# Patient Record
Sex: Female | Born: 1975 | Race: Black or African American | Hispanic: No | Smoking: Current every day smoker
Health system: Southern US, Community
[De-identification: ages and names within clinical notes are randomized; demographics above are authoritative.]

## PROBLEM LIST (undated history)

## (undated) DIAGNOSIS — F419 Anxiety disorder, unspecified: Secondary | ICD-10-CM

## (undated) DIAGNOSIS — I10 Essential (primary) hypertension: Secondary | ICD-10-CM

## (undated) DIAGNOSIS — F329 Major depressive disorder, single episode, unspecified: Secondary | ICD-10-CM

## (undated) DIAGNOSIS — F32A Depression, unspecified: Secondary | ICD-10-CM

---

## 2005-03-28 ENCOUNTER — Ambulatory Visit (HOSPITAL_COMMUNITY): Admission: RE | Admit: 2005-03-28 | Discharge: 2005-03-28 | Payer: Self-pay | Admitting: Gynecology

## 2005-04-24 ENCOUNTER — Ambulatory Visit: Payer: Self-pay | Admitting: Family Medicine

## 2005-05-17 ENCOUNTER — Ambulatory Visit: Payer: Self-pay | Admitting: Obstetrics & Gynecology

## 2005-05-18 ENCOUNTER — Inpatient Hospital Stay (HOSPITAL_COMMUNITY): Admission: AD | Admit: 2005-05-18 | Discharge: 2005-05-19 | Payer: Self-pay | Admitting: *Deleted

## 2005-05-18 ENCOUNTER — Ambulatory Visit: Payer: Self-pay | Admitting: Family Medicine

## 2005-05-21 ENCOUNTER — Ambulatory Visit: Payer: Self-pay | Admitting: Gynecology

## 2005-05-31 ENCOUNTER — Ambulatory Visit: Payer: Self-pay | Admitting: Obstetrics & Gynecology

## 2005-06-01 ENCOUNTER — Ambulatory Visit (HOSPITAL_COMMUNITY): Admission: RE | Admit: 2005-06-01 | Discharge: 2005-06-01 | Payer: Self-pay | Admitting: Gynecology

## 2005-06-04 ENCOUNTER — Ambulatory Visit: Payer: Self-pay | Admitting: Gynecology

## 2005-06-08 ENCOUNTER — Ambulatory Visit: Payer: Self-pay | Admitting: Obstetrics & Gynecology

## 2005-06-12 ENCOUNTER — Inpatient Hospital Stay (HOSPITAL_COMMUNITY): Admission: AD | Admit: 2005-06-12 | Discharge: 2005-06-13 | Payer: Self-pay | Admitting: Gynecology

## 2005-06-12 ENCOUNTER — Ambulatory Visit: Payer: Self-pay | Admitting: Obstetrics and Gynecology

## 2005-06-12 ENCOUNTER — Ambulatory Visit: Payer: Self-pay | Admitting: Family Medicine

## 2005-06-15 ENCOUNTER — Ambulatory Visit: Payer: Self-pay | Admitting: Obstetrics & Gynecology

## 2005-06-19 ENCOUNTER — Ambulatory Visit: Payer: Self-pay | Admitting: Family Medicine

## 2005-06-22 ENCOUNTER — Ambulatory Visit: Payer: Self-pay | Admitting: Obstetrics & Gynecology

## 2005-06-22 ENCOUNTER — Ambulatory Visit (HOSPITAL_COMMUNITY): Admission: RE | Admit: 2005-06-22 | Discharge: 2005-06-22 | Payer: Self-pay | Admitting: Gynecology

## 2005-06-27 ENCOUNTER — Ambulatory Visit: Payer: Self-pay | Admitting: Obstetrics & Gynecology

## 2005-06-28 ENCOUNTER — Ambulatory Visit: Payer: Self-pay | Admitting: Obstetrics & Gynecology

## 2005-07-03 ENCOUNTER — Ambulatory Visit: Payer: Self-pay | Admitting: Family Medicine

## 2005-07-10 ENCOUNTER — Ambulatory Visit: Payer: Self-pay | Admitting: Family Medicine

## 2005-07-12 ENCOUNTER — Ambulatory Visit (HOSPITAL_COMMUNITY): Admission: RE | Admit: 2005-07-12 | Discharge: 2005-07-12 | Payer: Self-pay | Admitting: Family Medicine

## 2005-07-17 ENCOUNTER — Ambulatory Visit: Payer: Self-pay | Admitting: Family Medicine

## 2005-07-18 ENCOUNTER — Inpatient Hospital Stay (HOSPITAL_COMMUNITY): Admission: AD | Admit: 2005-07-18 | Discharge: 2005-07-18 | Payer: Self-pay | Admitting: Gynecology

## 2005-07-18 ENCOUNTER — Ambulatory Visit: Payer: Self-pay | Admitting: *Deleted

## 2005-07-20 ENCOUNTER — Ambulatory Visit: Payer: Self-pay | Admitting: Family Medicine

## 2005-07-24 ENCOUNTER — Ambulatory Visit: Payer: Self-pay | Admitting: Gynecology

## 2005-07-24 ENCOUNTER — Inpatient Hospital Stay (HOSPITAL_COMMUNITY): Admission: AD | Admit: 2005-07-24 | Discharge: 2005-07-28 | Payer: Self-pay | Admitting: Gynecology

## 2005-08-04 ENCOUNTER — Inpatient Hospital Stay (HOSPITAL_COMMUNITY): Admission: AD | Admit: 2005-08-04 | Discharge: 2005-08-04 | Payer: Self-pay | Admitting: Obstetrics & Gynecology

## 2005-09-07 ENCOUNTER — Ambulatory Visit: Payer: Self-pay | Admitting: Gynecology

## 2005-09-10 ENCOUNTER — Ambulatory Visit: Payer: Self-pay | Admitting: Gynecology

## 2006-07-30 ENCOUNTER — Emergency Department: Payer: Self-pay | Admitting: Emergency Medicine

## 2006-11-02 ENCOUNTER — Emergency Department: Payer: Self-pay | Admitting: Emergency Medicine

## 2007-02-15 ENCOUNTER — Emergency Department: Payer: Self-pay | Admitting: Emergency Medicine

## 2007-05-29 IMAGING — US US OB COMP +14 WK
1 series · 13 of 28 positions shown · non-contrast
Comparison: none

CLINICAL DATA: Uncertain menstrual dates.  Evaluate dating and anatomy.

[Series 1: us ob comp +14 wk · 0.29mm/px · 13 of 70 slices shown]
[im 3/70]
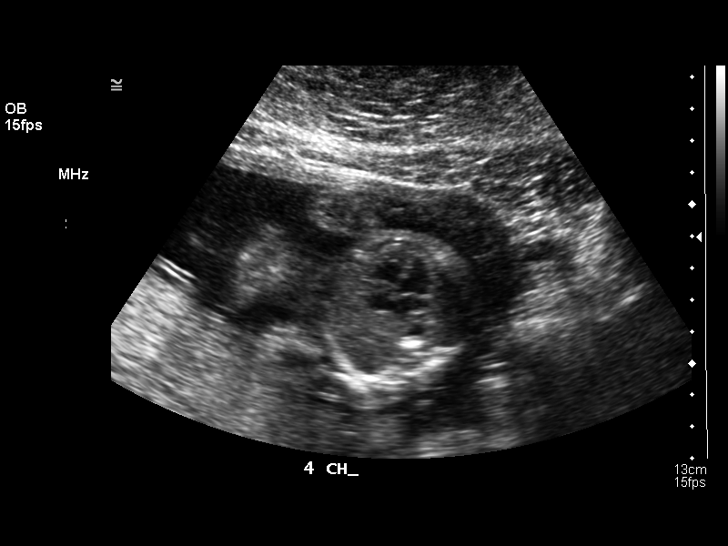
[im 8/70]
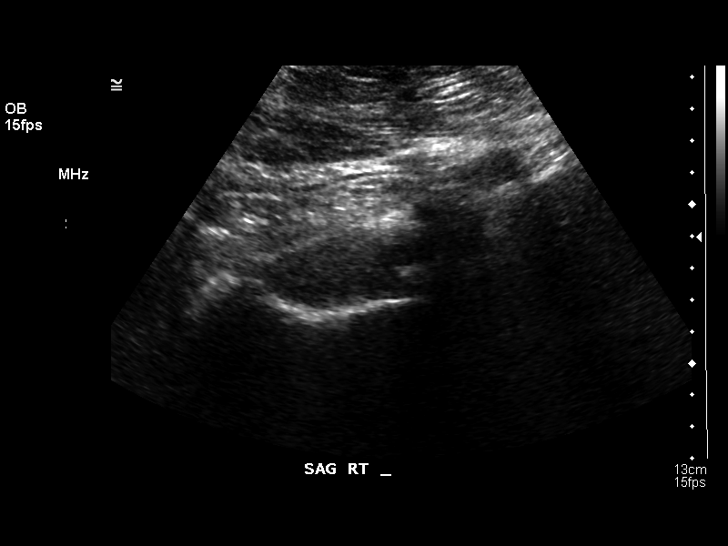
[im 13/70]
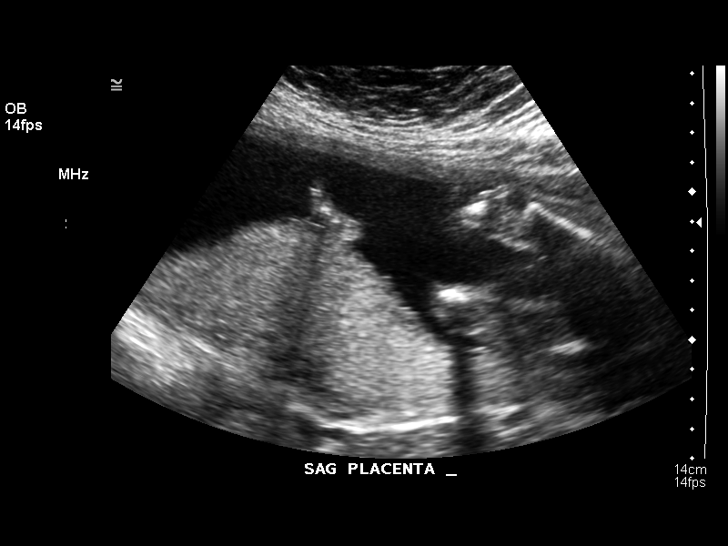
[im 18/70]
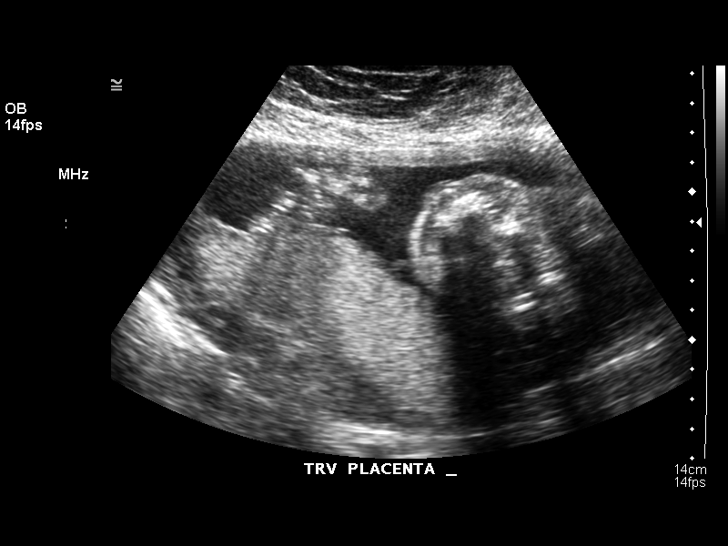
[im 24/70]
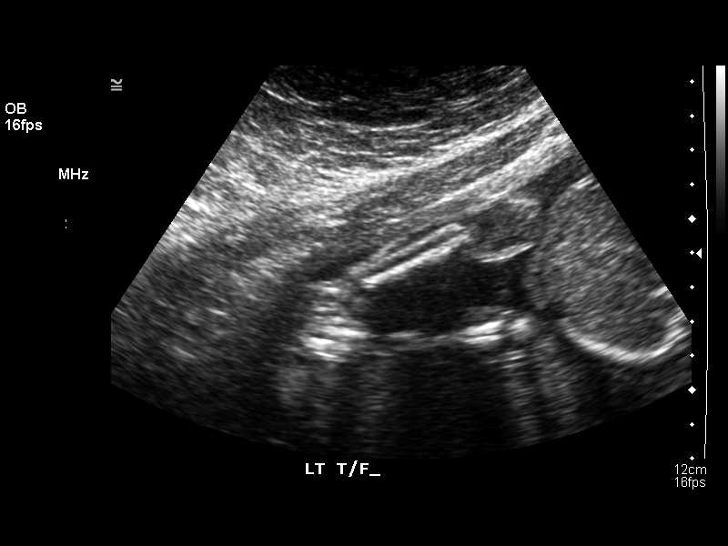
[im 29/70]
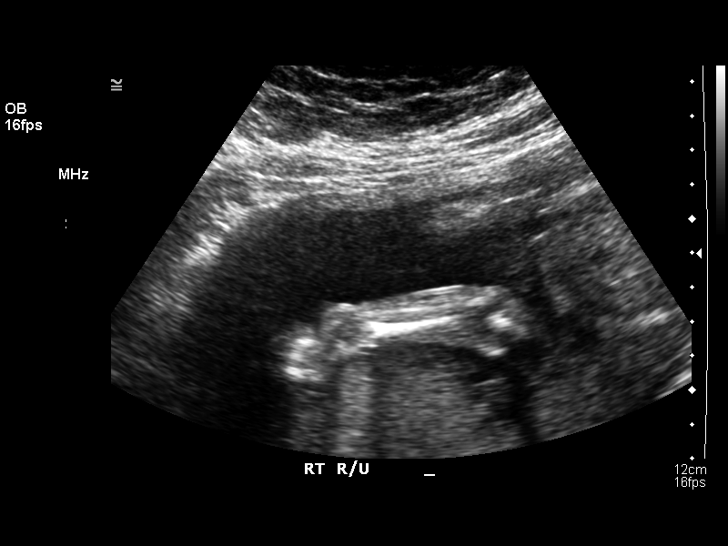
[im 36/70]
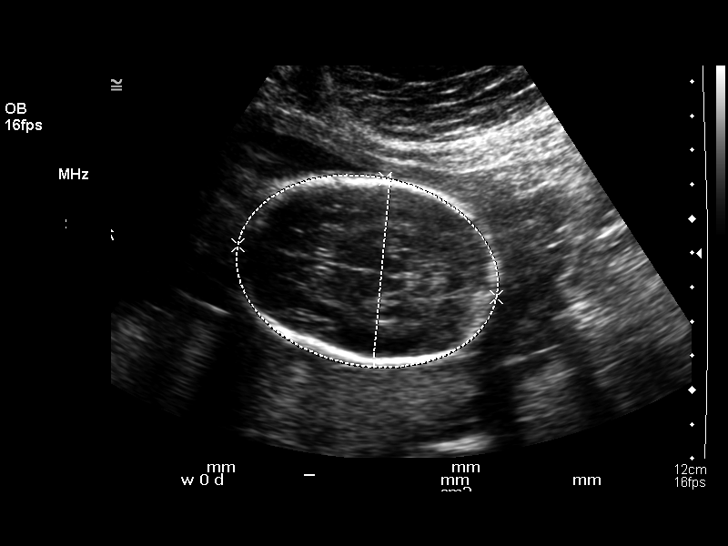
[im 41/70]
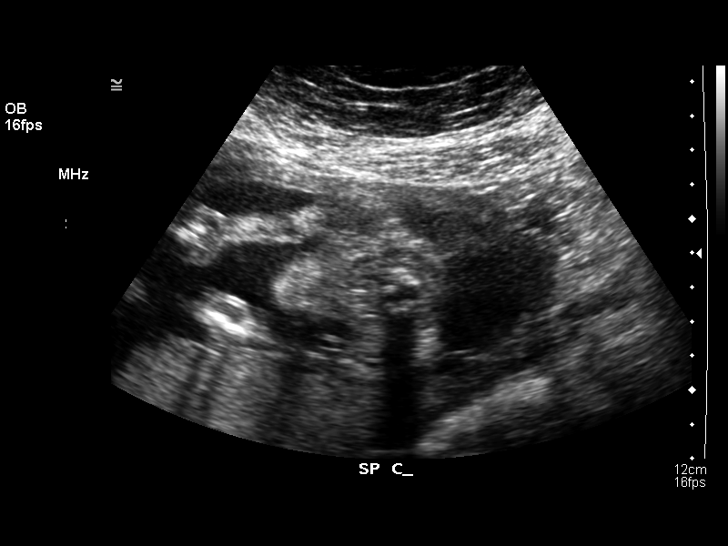
[im 47/70]
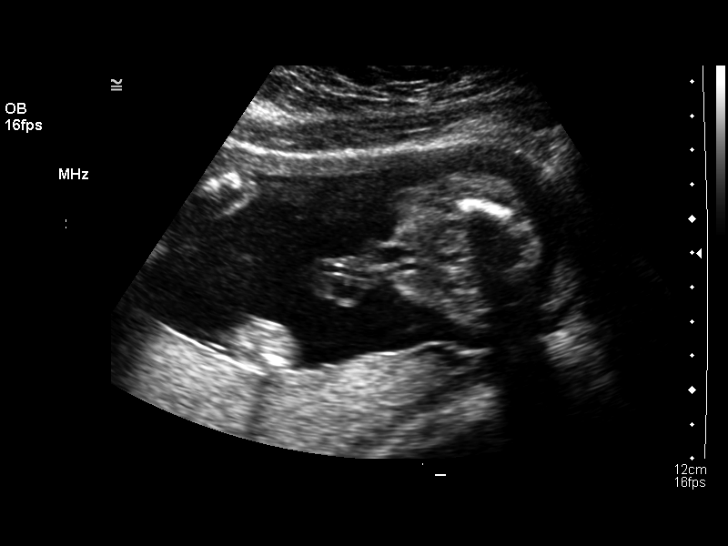
[im 52/70]
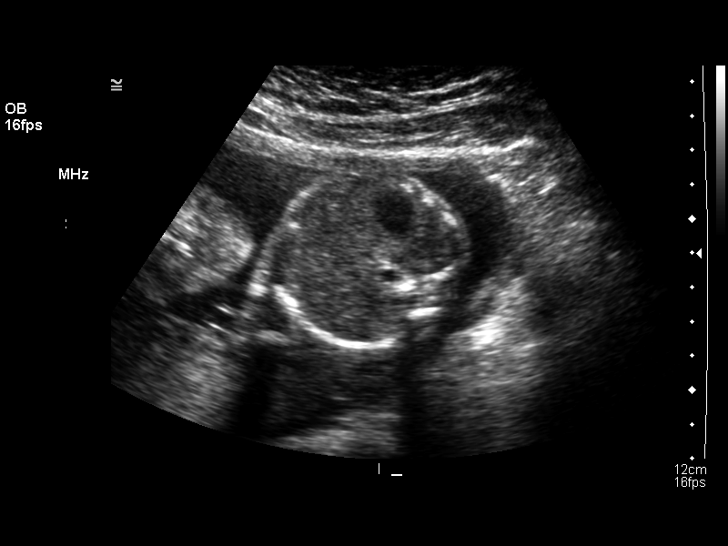
[im 57/70]
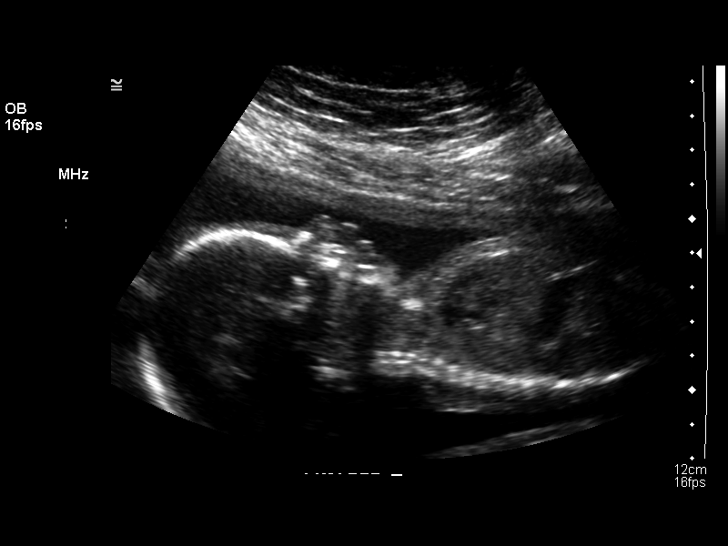
[im 62/70]
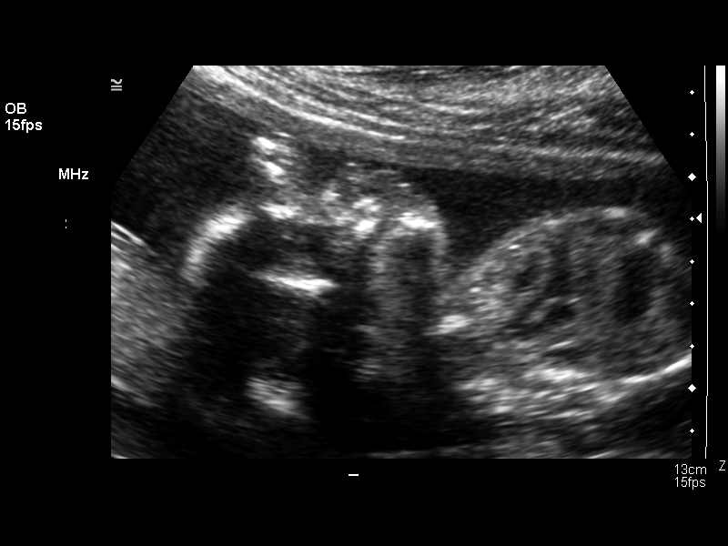
[im 67/70]
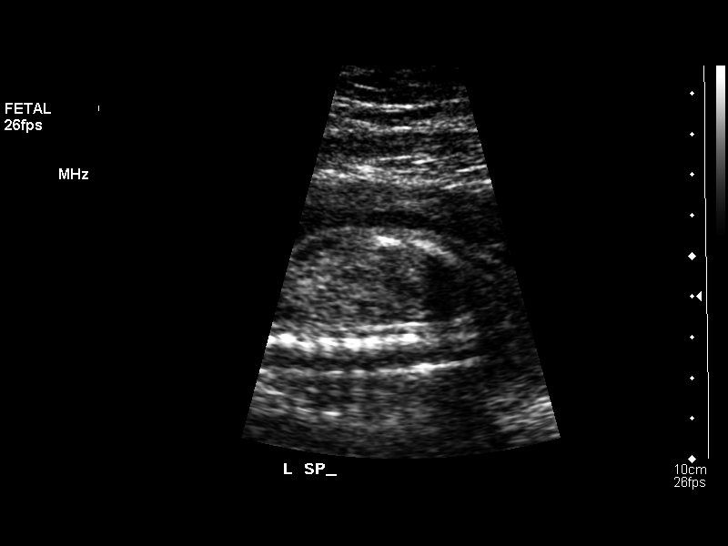

[13 of 28 positions shown; findings below may reference images not displayed]

OBSTETRICAL ULTRASOUND:
 Number of Fetuses:  1
 Heart Rate:  147
 Movement:  Yes
 Breathing:  No  
 Presentation:  Breech
 Placental Location:  Posterior
 Grade:  I
 Previa:  No
 Amniotic Fluid (Subjective):  Normal
 Amniotic Fluid (Objective):   3.9 cm Vertical pocket 

 FETAL BIOMETRY
 BPD:   5.2 cm   21 w 5 d
 HC:   20.5 cm   22 w 5 d
 AC:   17.3 cm  22 w 1 d
 FL:    3.9 cm   22 w 4 d

 MEAN GA:  22 w 2 d  US EDC:  07/30/05

 FETAL ANATOMY
 Lateral Ventricles:    Visualized 
 Thalami/CSP:      Visualized 
 Posterior Fossa:  Visualized 
 Nuchal Region:    N/A
 Spine:      Visualized 
 4 Chamber Heart on Left:      Visualized 
 Stomach on Left:      Visualized 
 3 Vessel Cord:    Visualized 
 Cord Insertion site:    Visualized 
 Kidneys:  Visualized 
 Bladder:  Visualized 
 Extremities:      Visualized 

 ADDITIONAL ANATOMY VISUALIZED:  LVOT, upper lip, orbits, profile, diaphragm, heel, and male genitalia.

 MATERNAL UTERINE AND ADNEXAL FINDINGS
 Cervix: 3.3 cm Transabdominally
IMPRESSION: 1.  Single living intrauterine fetus with mean gestational age of 22 weeks 2 days and sonographic EDC of 07/30/05.
 2.  No evidence of fetal anatomic abnormality.

## 2007-08-02 IMAGING — US US FETAL BPP W/O NONSTRESS
1 series · 14 of 28 positions shown · non-contrast
Comparison: none

CLINICAL DATA: 31 week 4 day assigned gestational age.  Pregnancy induced hypertension.

[Series 1: us fetal bpp w/o nonstress · 0.28mm/px · 14 of 32 slices shown]
[im 2/32]
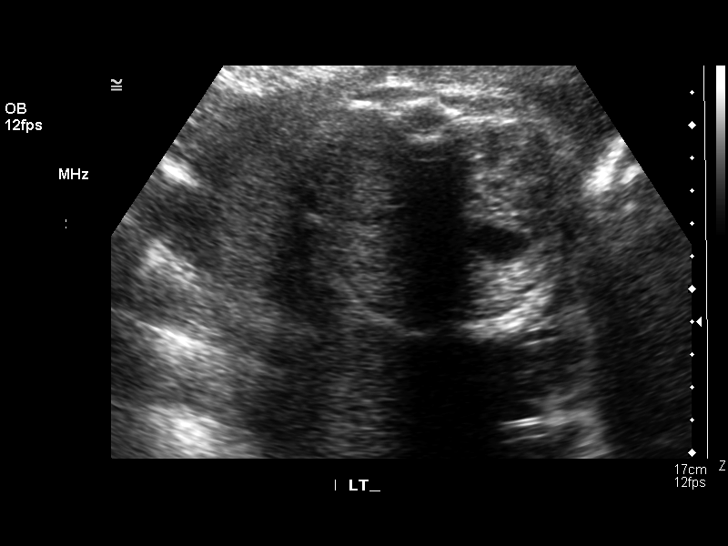
[im 4/32]
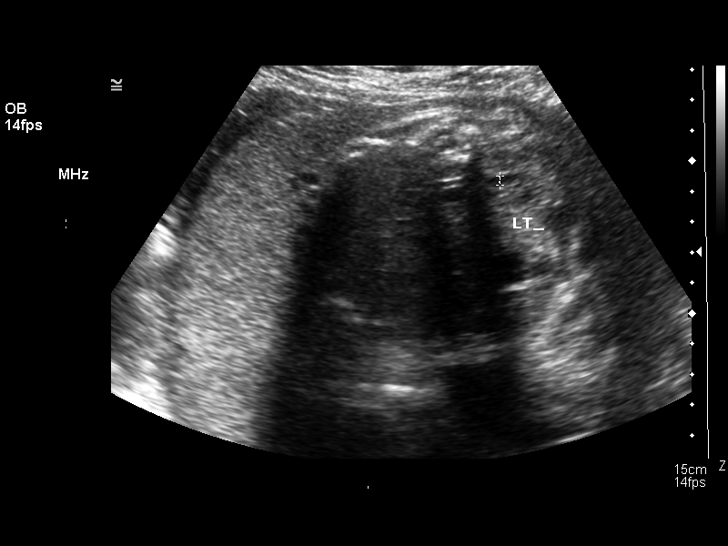
[im 6/32]
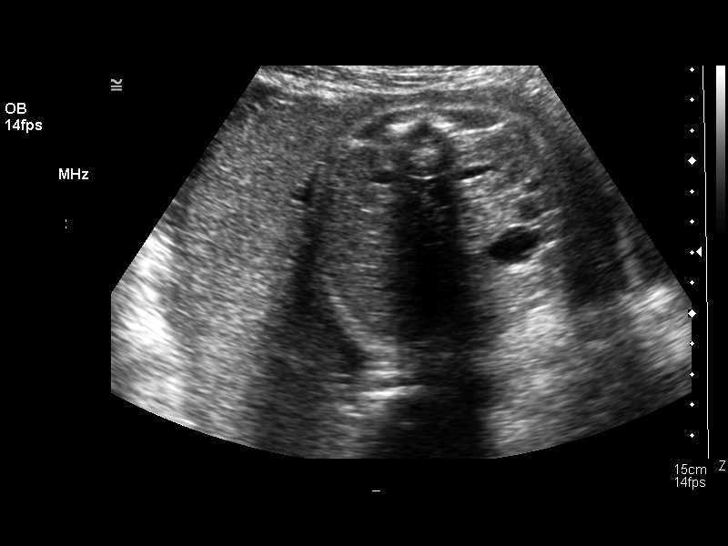
[im 9/32]
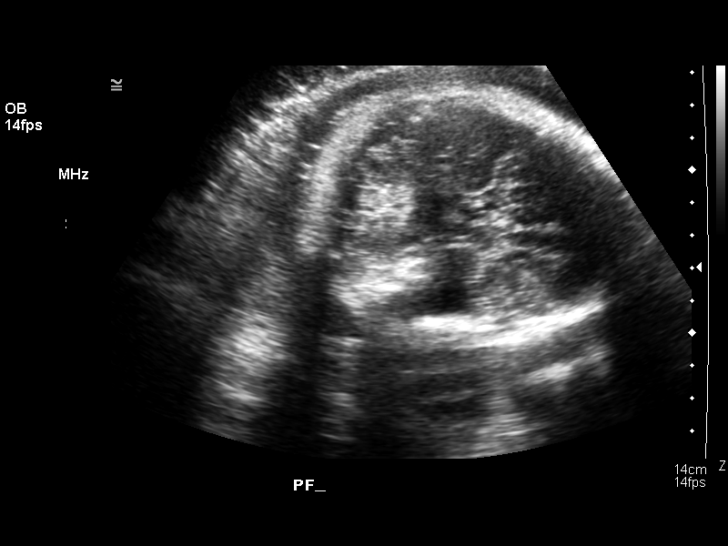
[im 11/32]
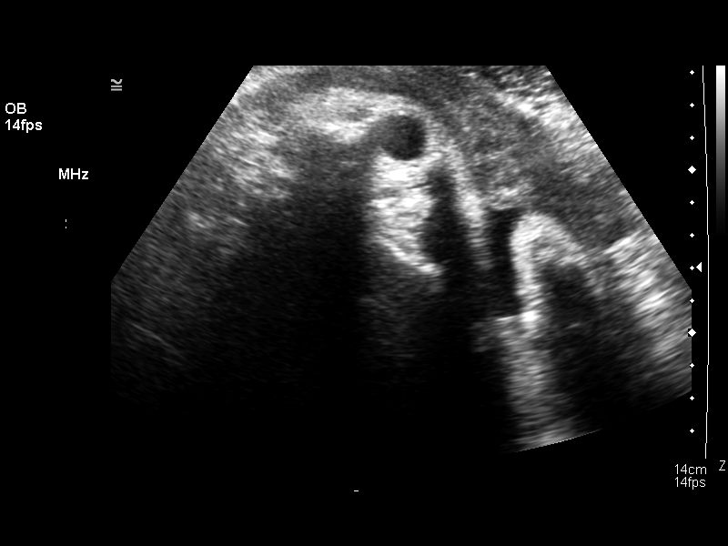
[im 13/32]
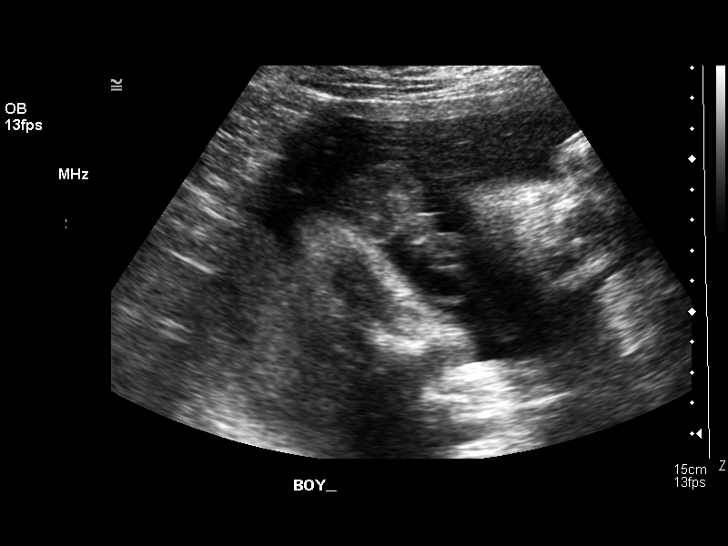
[im 15/32]
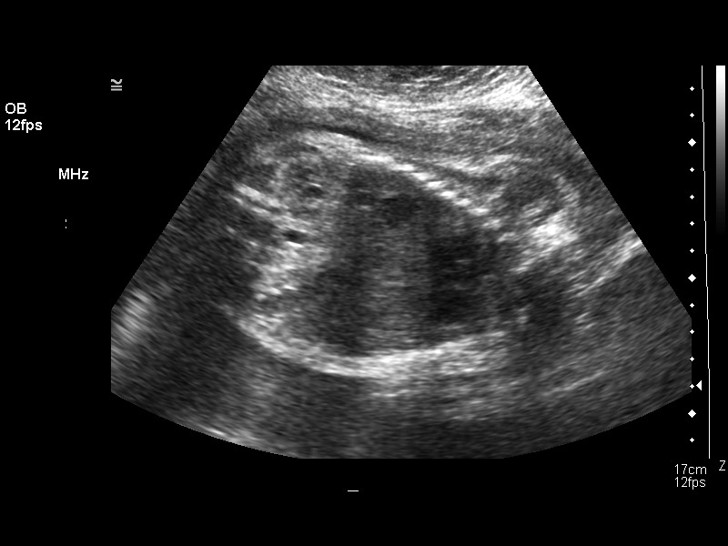
[im 18/32]
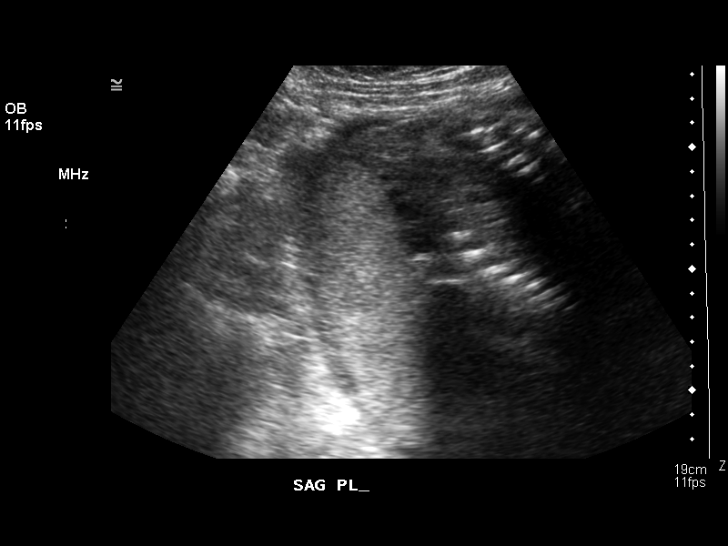
[im 20/32]
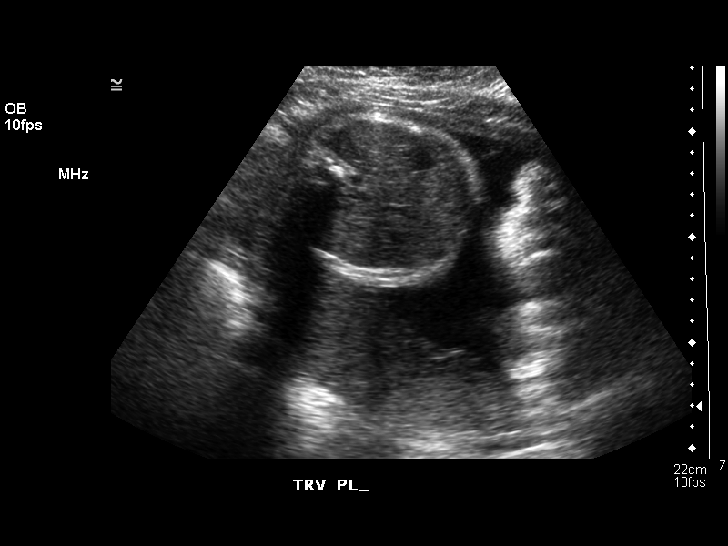
[im 22/32]
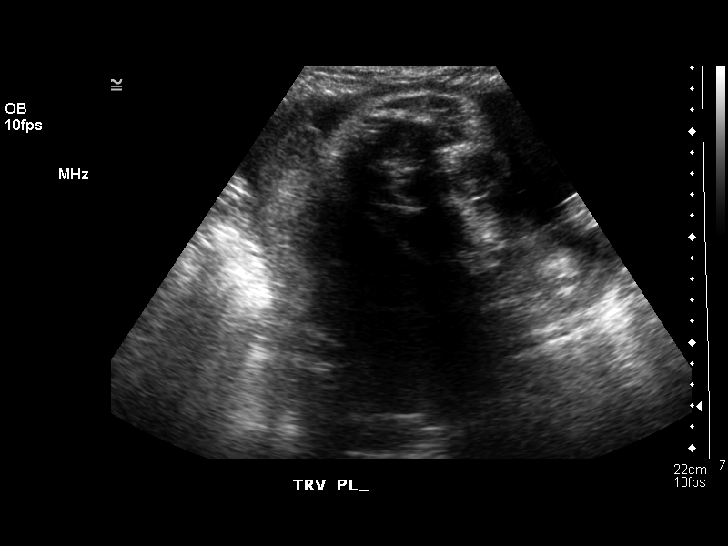
[im 25/32]
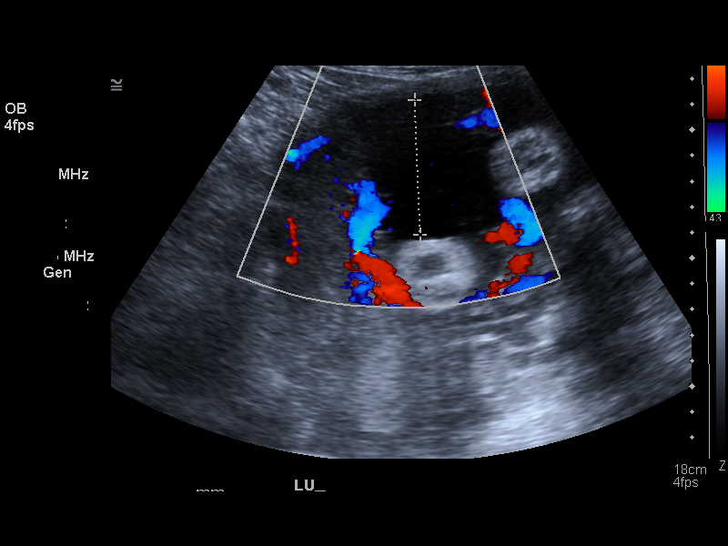
[im 27/32]
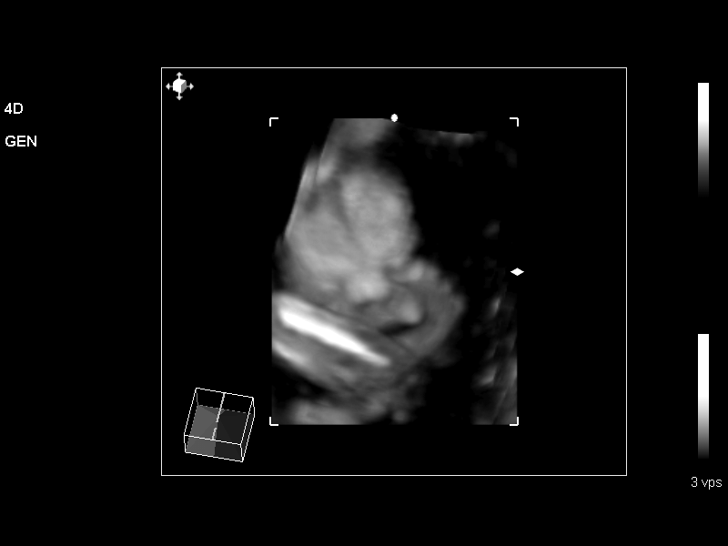
[im 29/32]
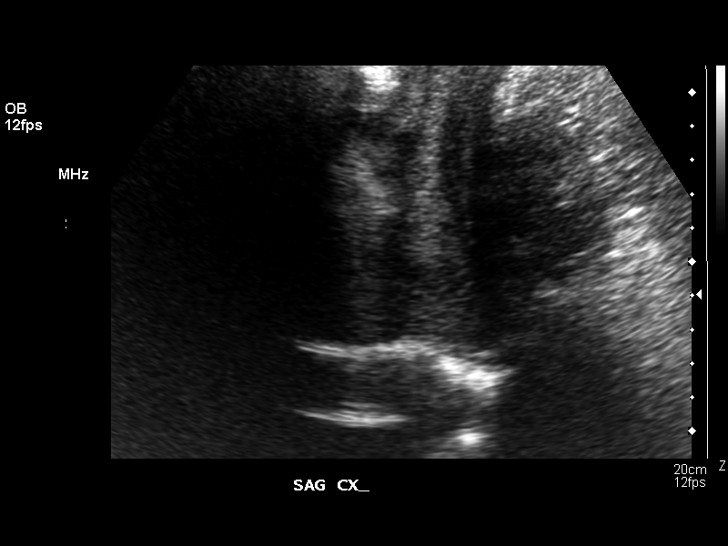
[im 32/32]
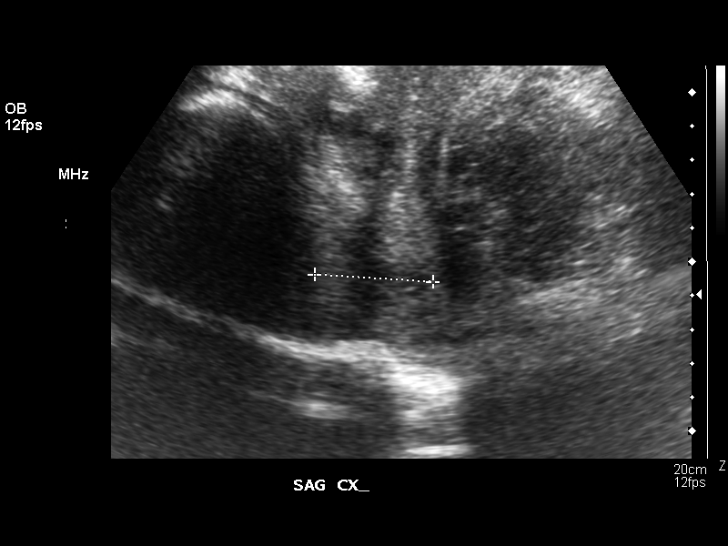

[14 of 28 positions shown; findings below may reference images not displayed]

BIOPHYSICAL PROFILE

Number of Fetuses:  1
Heart rate:  157
Movement:  Yes
Breathing:  Yes
Presentation:  Cephalic
Placental Location:  Posterior, right lateral 
Grade:  I
Previa:  No
Amniotic Fluid (Subjective):  Normal
Amniotic Fluid (Objective):  15.9 cm AFI (5th -95th%ile = 8.6 ? 24.2 cm for 32 wks)

Fetal measurements and complete anatomic evaluation were not requested.  The following fetal anatomy was visualized on this exam: Lateral ventricles, thalami, stomach, 3-vessel cord, kidneys, bladder, orbits, and diaphragm.  

BPP SCORING
Movements:  2  Time:  30 minutes
Breathing:  0  (Breathing seen, but not sustained)
Tone:  2
Amniotic Fluid:  2
Total Score:  6

MATERNAL UTERINE AND ADNEXAL FINDINGS
Cervix:  3.5 cm Translabially
IMPRESSION: Single living intrauterine fetus in cephalic presentation.  Biophysical profile score [DATE].

## 2007-08-13 IMAGING — US US OB FOLLOW-UP
1 series · 13 of 28 positions shown · non-contrast
Comparison: none

CLINICAL DATA: Nonreactive NST.

[Series 1: us ob follow-up · 0.35mm/px · 13 of 40 slices shown]
[im 2/40]
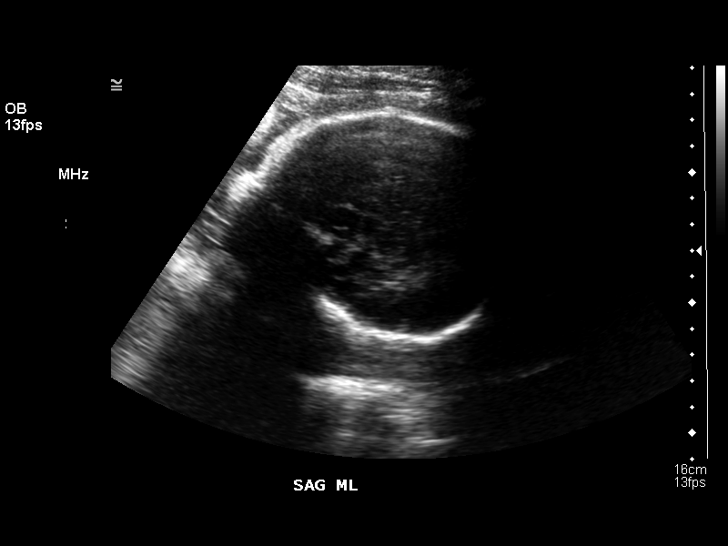
[im 5/40]
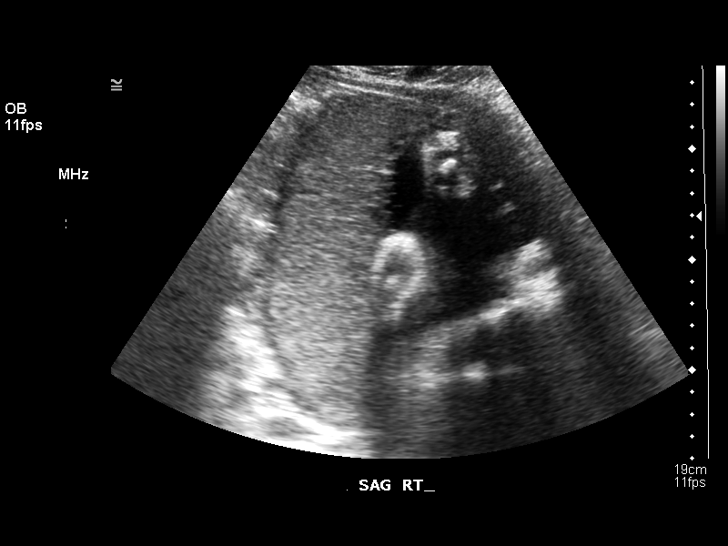
[im 8/40]
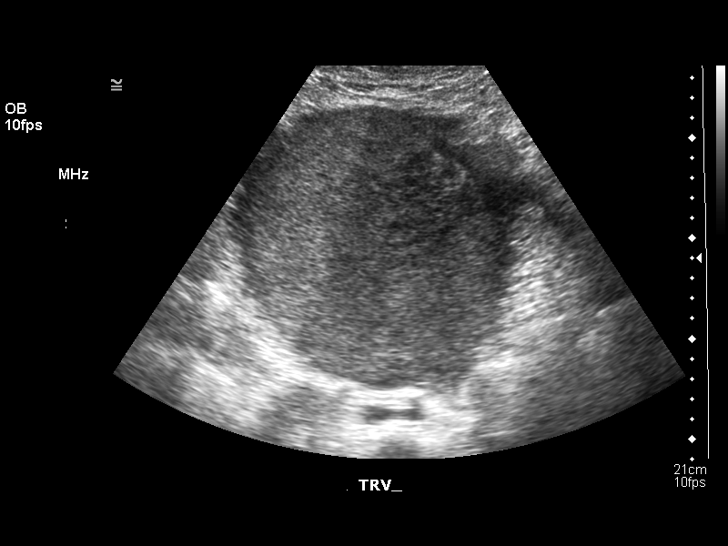
[im 11/40]
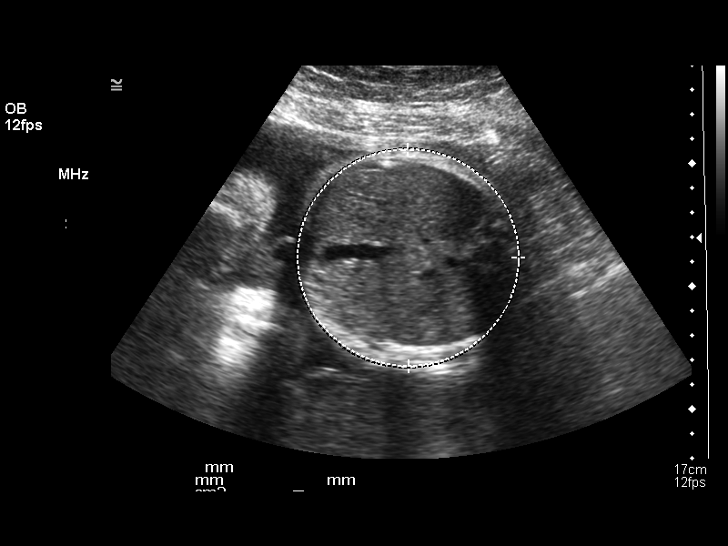
[im 14/40]
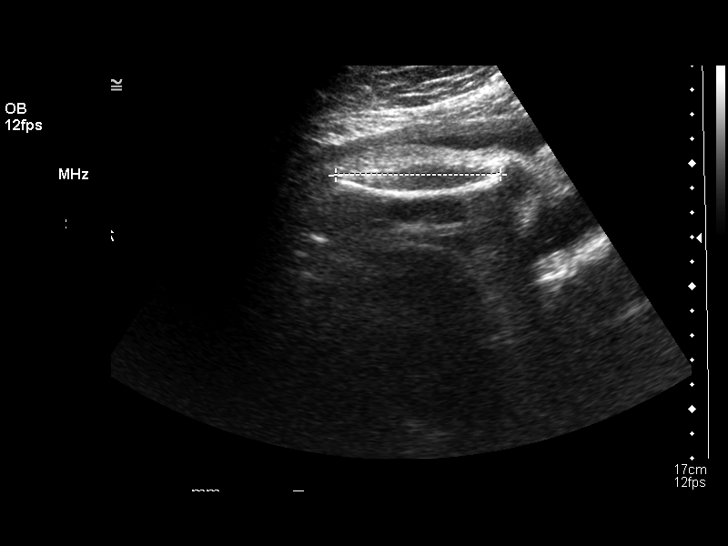
[im 16/40]
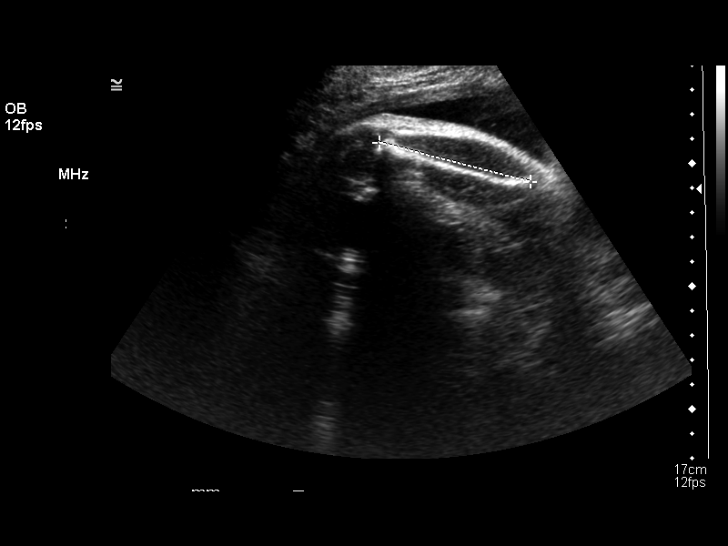
[im 21/40]
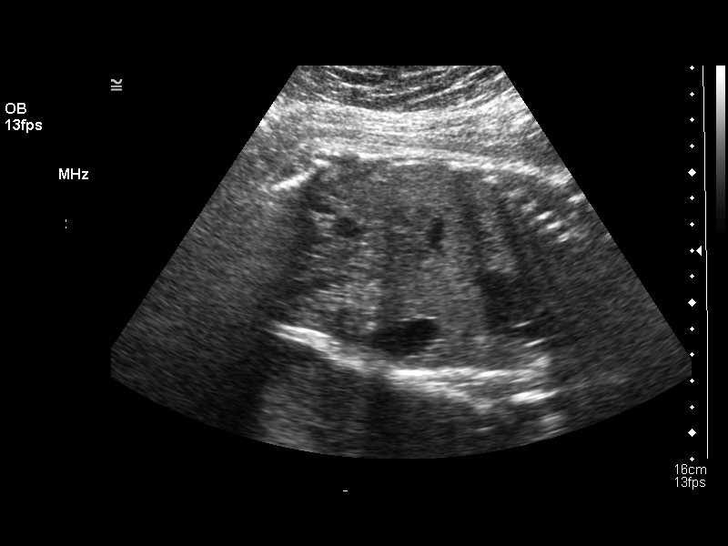
[im 24/40]
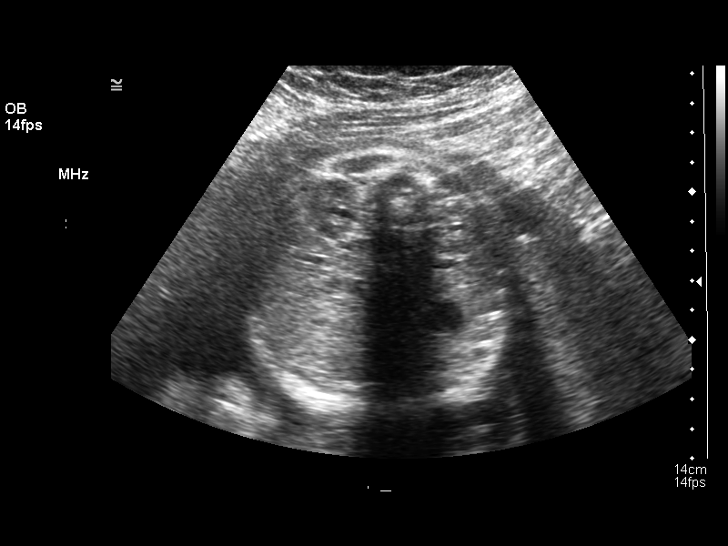
[im 27/40]
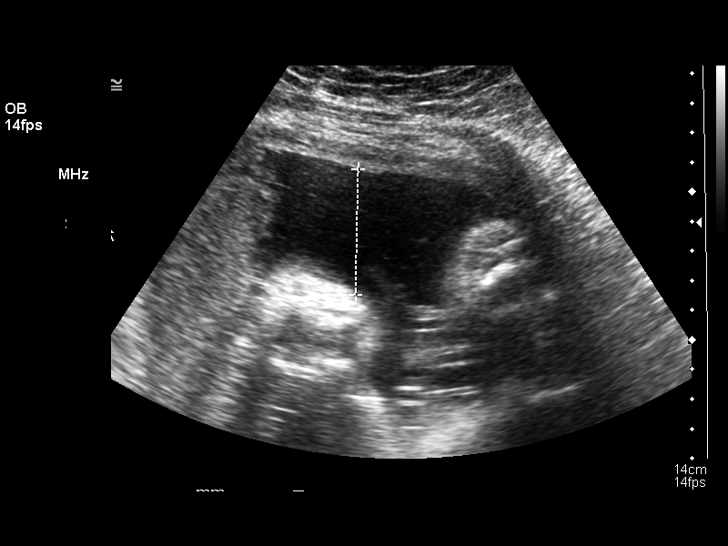
[im 29/40]
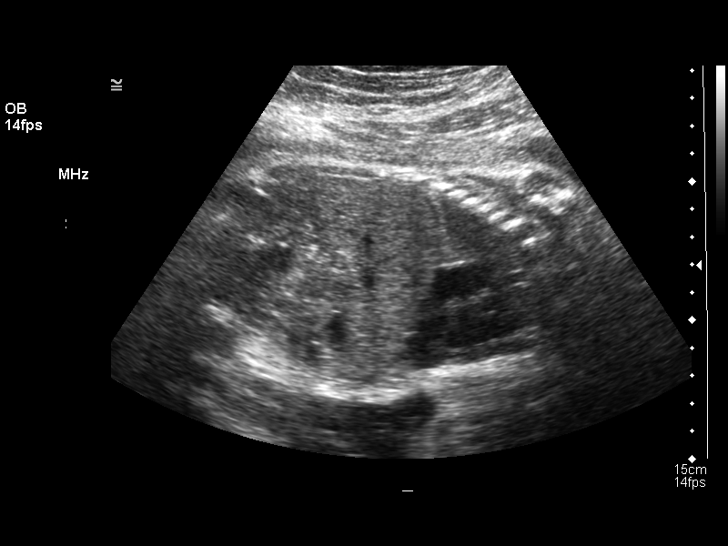
[im 32/40]
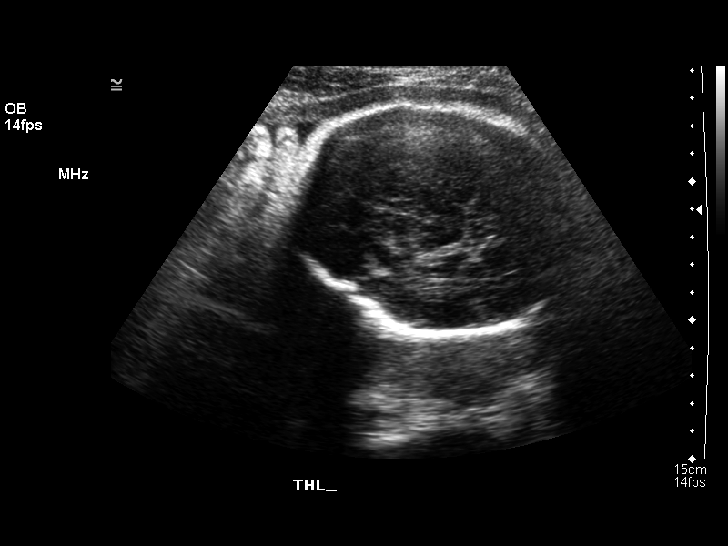
[im 35/40]
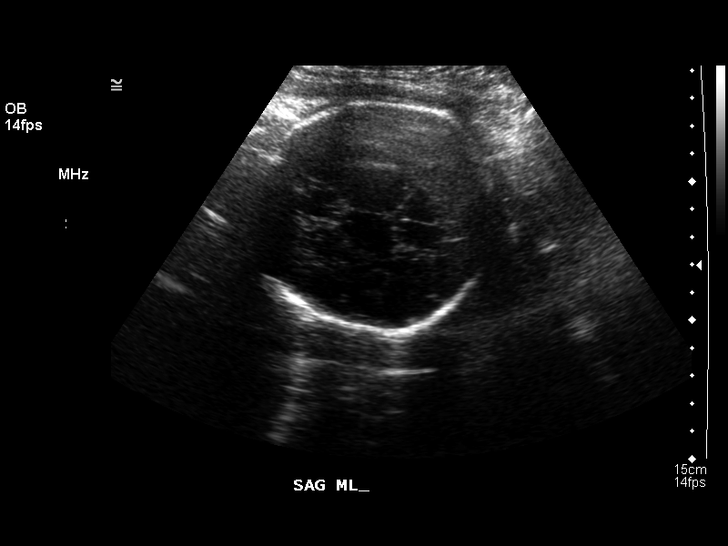
[im 38/40]
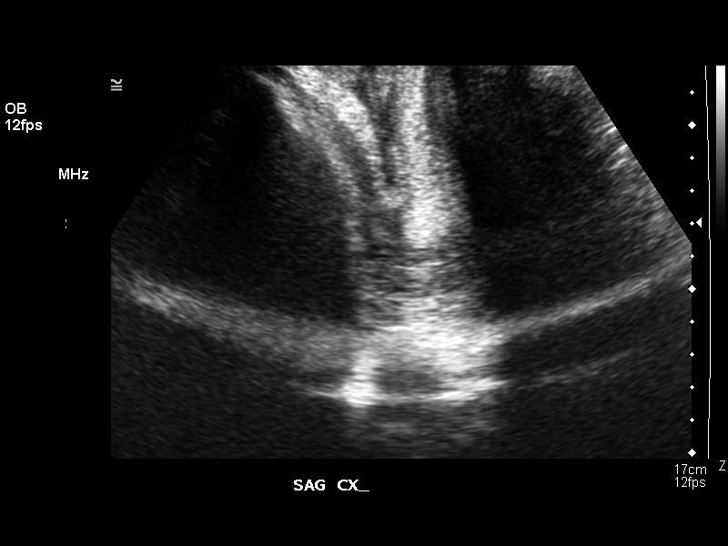

[13 of 28 positions shown; findings below may reference images not displayed]

OBSTETRICAL ULTRASOUND RE-EVALUATION:
 Number of Fetuses: 1
 Heart Rate:  141
 Movement:  Yes
 Breathing: Yes
 Presentation:  Cephalic
 Placental Location:  Fundal
 Grade: I
 Previa:  Normal
 Amniotic Fluid (subjective):  Normal
 Amniotic Fluid (objective):  14.2 cm AFI (5th -95th%ile = 8.3 – 24.5 cm for 33 wks)

 FETAL BIOMETRY
 BPD:  8.2 cm   33 w 1 d
 HC:  30.0 cm  33 w 2 d
 AC:  29.4 cm   33 w 3 d
 FL:   6.5 cm   33 w 5 d

 Mean GA:  33 w 3 d  US EDC:  07/30/05
 Assigned GA:  33 w 1 d  Assigned EDC:  07/30/05

 EFW: 9998 g (H) 75th – 90th%ile (4218 – 5753 g) For 33 wks

 FETAL ANATOMY
 Lateral Ventricles:  Visualized 
 Thalami/CSP:  Visualized 
 Posterior Fossa:  Visualized 
 Nuchal Region:  N/A
 Spine:  Previously seen 
 4 Chamber Heart on Left:  Previously seen 
 Stomach on Left:  Visualized 
 3 Vessel Cord:  Previously seen 
 Cord Insertion Site:  Previously seen 
 Kidneys:  Visualized 
 Bladder:  Visualized 
 Extremities:  Previously seen 

 ADDITIONAL ANATOMY VISUALIZED:  Diaphragm and male genitalia.

 MATERNAL UTERINE AND ADNEXAL FINDINGS
 Cervix:  4.0 cm Translabially

 BIOPHYSICAL PROFILE

 Movement:  2    Time: 10 minutes
 Breathing:  2
 Tone:  2
 Amniotic Fluid:  2

 Total Score:  8
IMPRESSION: Single live intrauterine gestation in cephalic presentation with average ultrasound age of 33 weeks 3 days which is concordant with the assigned gestational age of 33 weeks 1 day.  The cervix is long and closed.

## 2007-09-12 IMAGING — US US FETAL BPP W/O NONSTRESS
1 series · 14 of 17 positions shown · non-contrast
Comparison: none

CLINICAL DATA: Assess fetal well-being.

[Series 1: us fetal bpp w/o nonstress · 0.41mm/px · 17 acquisitions, 14 frames shown]
[im 1/17]
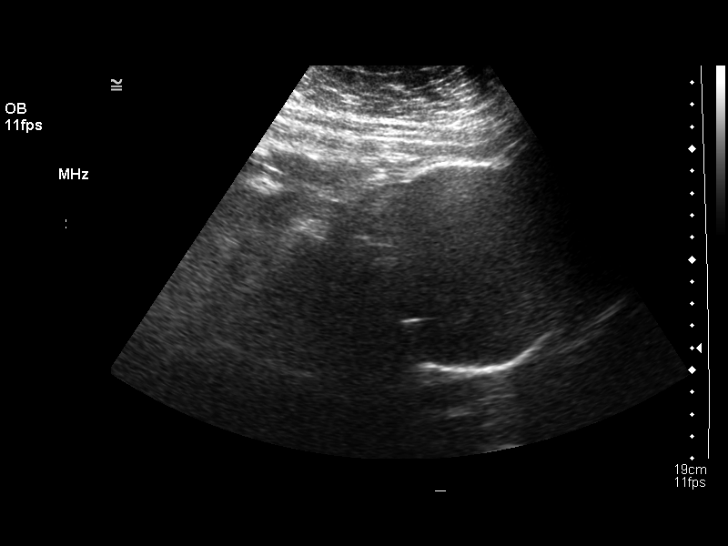
[im 2/17]
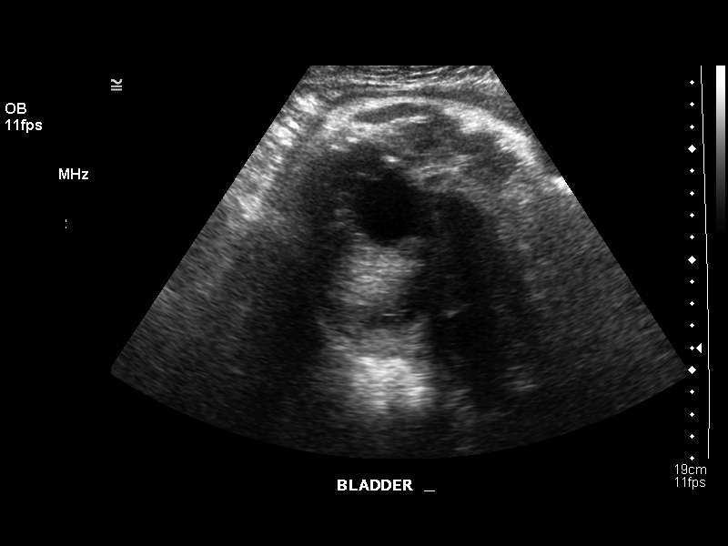
[im 4/17]
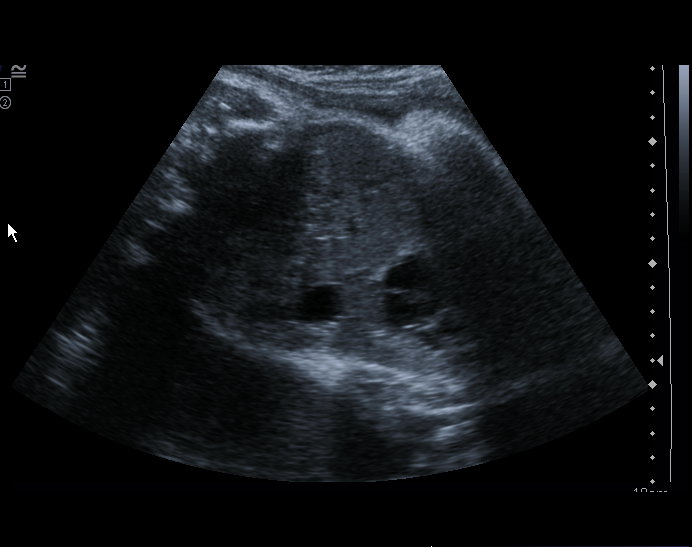
[im 5/17]
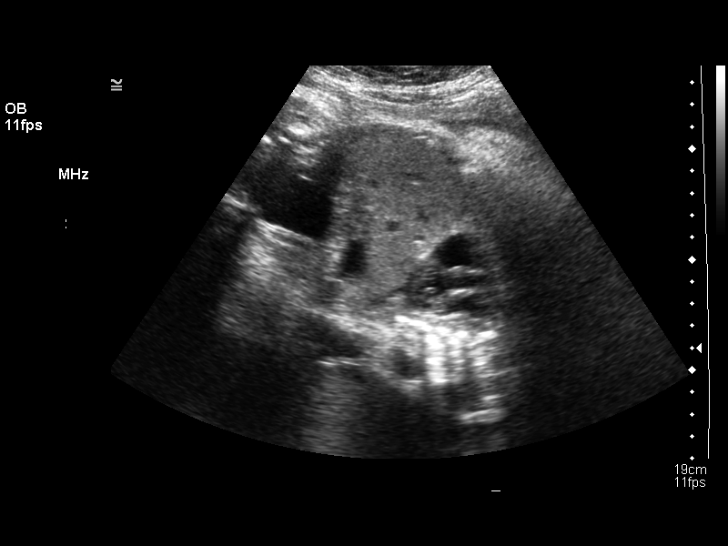
[im 6/17]
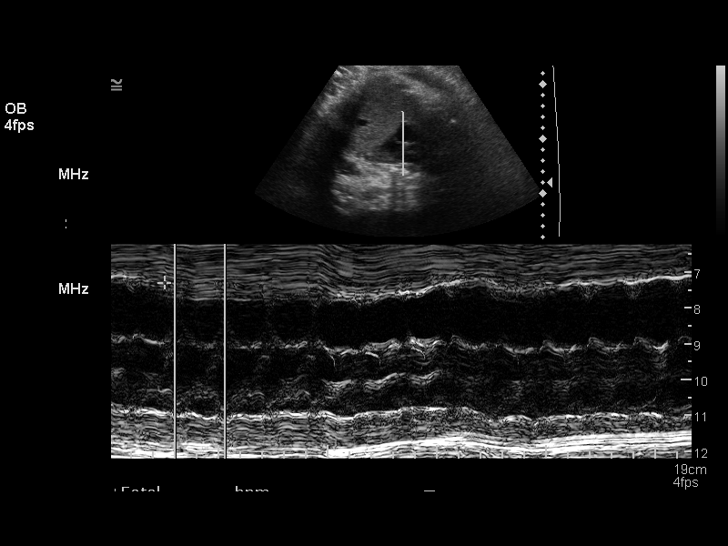
[im 7/17]
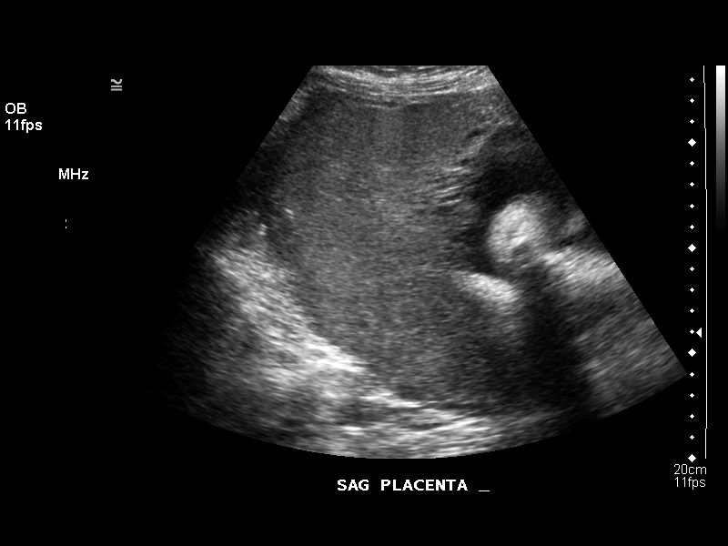
[im 8/17]
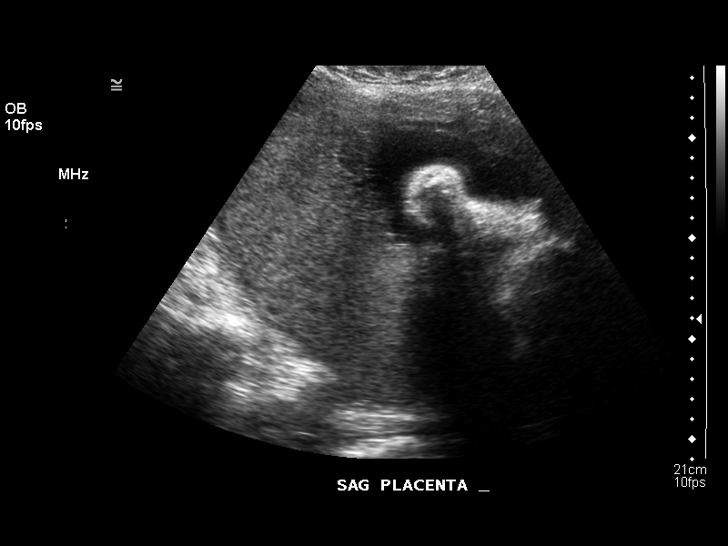
[im 10/17]
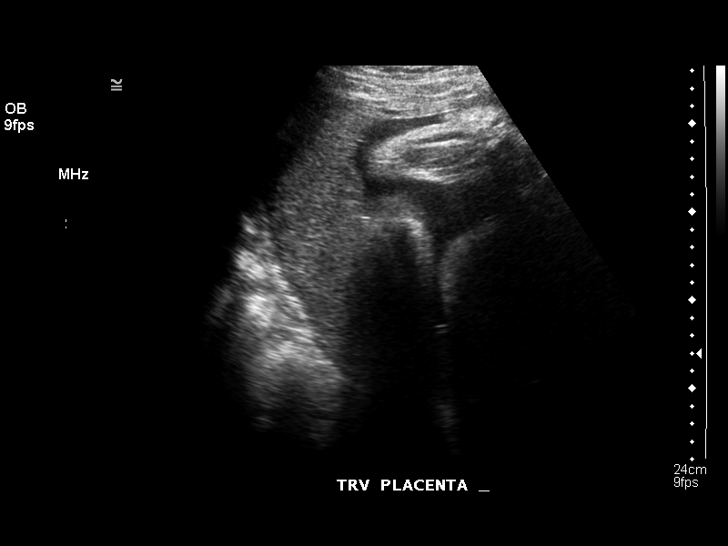
[im 11/17]
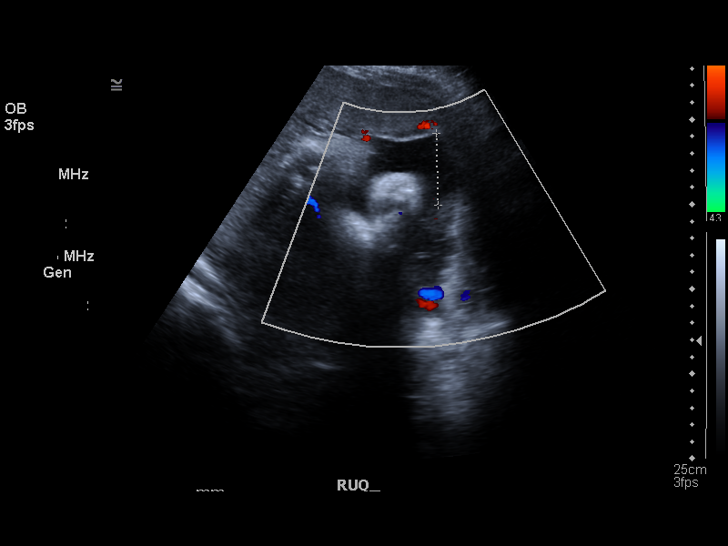
[im 12/17]
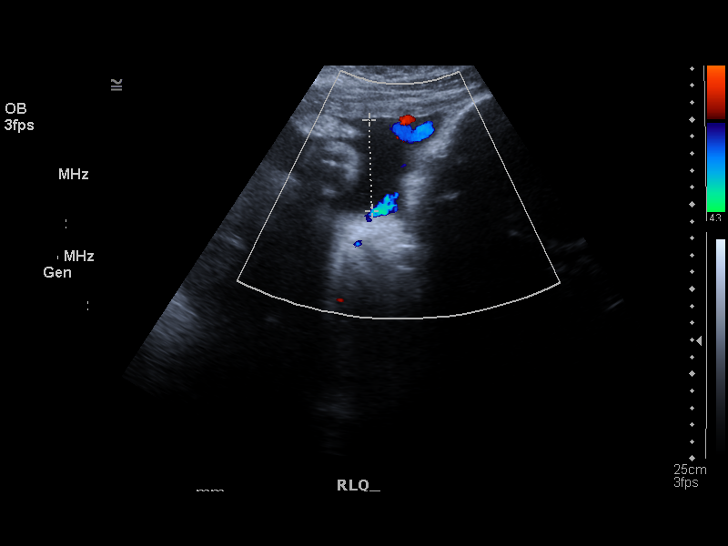
[im 13/17]
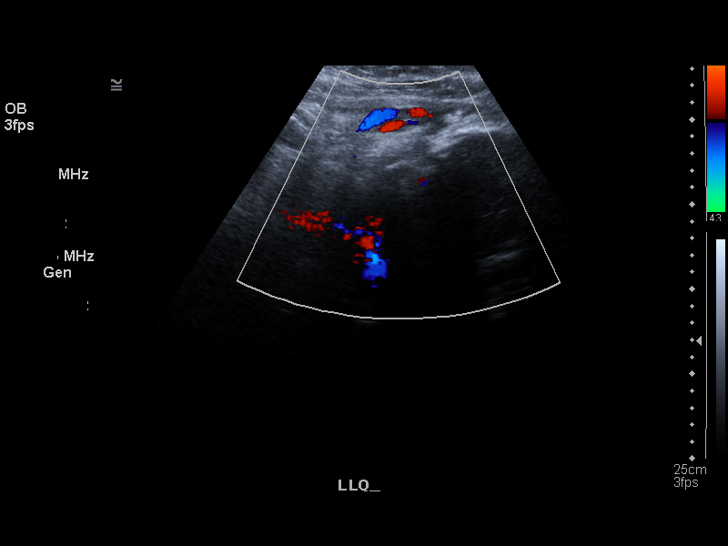
[im 14/17]
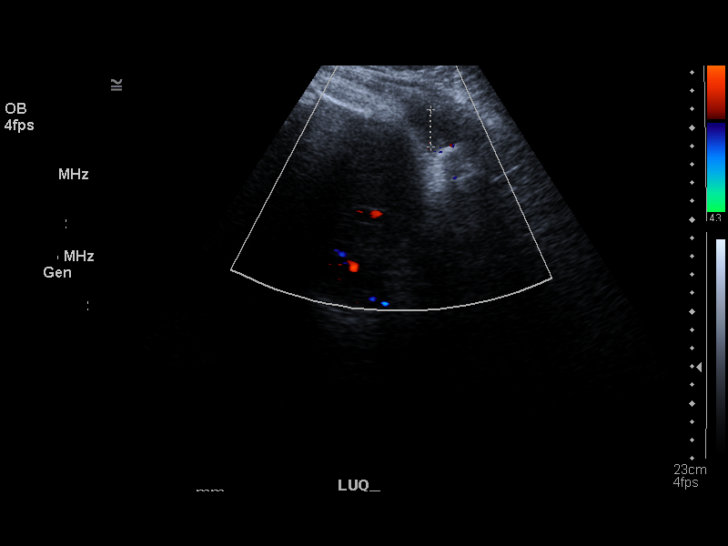
[im 16/17]
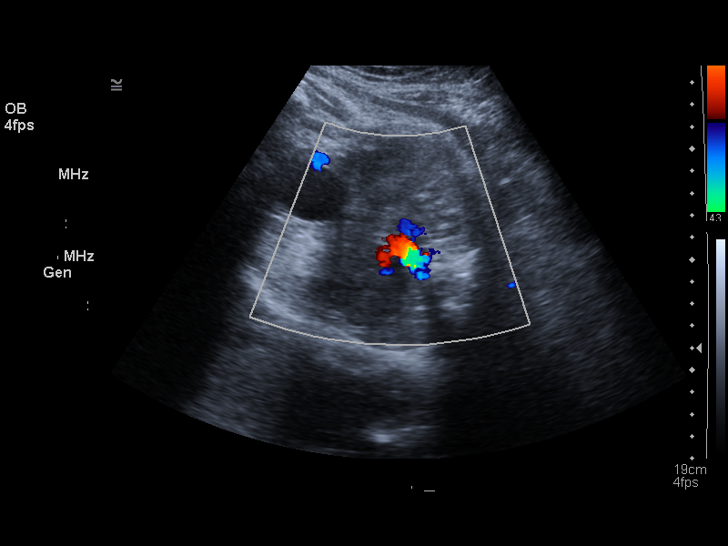
[im 17/17]
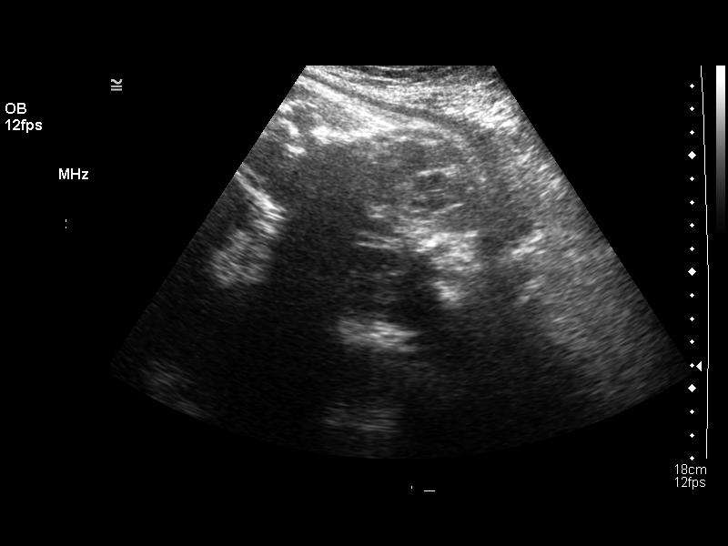

[14 of 17 positions shown; findings below may reference images not displayed]

BIOPHYSICAL PROFILE

 Number of Fetuses: 1
 Heart rate:  131
 Movement:  Yes
 Breathing:  Yes
 Presentation:  Cephalic
 Placental Location:  Posterior
 Grade:  I
 Previa:  No
 Amniotic Fluid (Subjective):  Normal
 Amniotic Fluid (Objective):  11.6 cm AFI (5th -95th%ile = 7.5 ? 24.4 cm for 37 wks)

 Fetal measurements and complete anatomic evaluation were not requested.  The following fetal anatomy was visualized on this exam:  Stomach, kidneys, bladder, diaphragm, and male genitalia.

 BPP SCORING
 Movements:  2  Time:  10 minutes
 Breathing:  2
 Tone:  2
 Amniotic Fluid:  2
 Total Score:  8

 MATERNAL UTERINE AND ADNEXAL FINDINGS
 Cervix: Not evaluated > 34 wks
IMPRESSION: 1.  Biophysical profile score [DATE].
 2.  Subjectively and quantitatively normal amniotic fluid volume.
 3.  No late developing fetal anatomic abnormalities are identified associated with the stomach, kidneys, or bladder.  The lateral ventricles and four chamber heart view could not be seen with confidence due to positioning on today?s exam.

## 2008-10-02 ENCOUNTER — Emergency Department: Payer: Self-pay | Admitting: Unknown Physician Specialty

## 2009-01-14 ENCOUNTER — Emergency Department: Payer: Self-pay | Admitting: Emergency Medicine

## 2009-05-14 ENCOUNTER — Emergency Department: Payer: Self-pay | Admitting: Emergency Medicine

## 2009-06-08 ENCOUNTER — Emergency Department: Payer: Self-pay | Admitting: Emergency Medicine

## 2009-06-18 ENCOUNTER — Emergency Department: Payer: Self-pay | Admitting: Emergency Medicine

## 2009-07-15 ENCOUNTER — Emergency Department: Payer: Self-pay | Admitting: Emergency Medicine

## 2009-07-28 ENCOUNTER — Emergency Department: Payer: Self-pay | Admitting: Emergency Medicine

## 2009-09-09 ENCOUNTER — Emergency Department: Payer: Self-pay | Admitting: Emergency Medicine

## 2009-10-07 ENCOUNTER — Emergency Department: Payer: Self-pay | Admitting: Emergency Medicine

## 2009-11-11 ENCOUNTER — Emergency Department: Payer: Self-pay | Admitting: Internal Medicine

## 2010-01-11 ENCOUNTER — Emergency Department: Payer: Self-pay | Admitting: Emergency Medicine

## 2010-02-05 ENCOUNTER — Emergency Department: Payer: Self-pay | Admitting: Emergency Medicine

## 2010-02-19 ENCOUNTER — Encounter: Payer: Self-pay | Admitting: Gynecology

## 2010-04-07 ENCOUNTER — Emergency Department: Payer: Self-pay | Admitting: Unknown Physician Specialty

## 2010-05-07 ENCOUNTER — Emergency Department: Payer: Self-pay | Admitting: Emergency Medicine

## 2010-06-01 ENCOUNTER — Emergency Department: Payer: Self-pay | Admitting: Emergency Medicine

## 2010-07-02 ENCOUNTER — Emergency Department: Payer: Self-pay | Admitting: Emergency Medicine

## 2010-09-23 ENCOUNTER — Emergency Department: Payer: Self-pay | Admitting: Internal Medicine

## 2010-11-24 ENCOUNTER — Emergency Department: Payer: Self-pay | Admitting: Unknown Physician Specialty

## 2010-11-25 ENCOUNTER — Emergency Department: Payer: Self-pay | Admitting: Unknown Physician Specialty

## 2010-12-22 ENCOUNTER — Emergency Department: Payer: Self-pay

## 2011-01-20 ENCOUNTER — Emergency Department: Payer: Self-pay | Admitting: Emergency Medicine

## 2011-02-10 ENCOUNTER — Emergency Department: Payer: Self-pay | Admitting: Internal Medicine

## 2011-02-10 LAB — RAPID INFLUENZA A&B ANTIGENS

## 2011-04-07 ENCOUNTER — Emergency Department: Payer: Self-pay | Admitting: Emergency Medicine

## 2011-04-08 ENCOUNTER — Emergency Department: Payer: Self-pay | Admitting: Emergency Medicine

## 2011-05-13 ENCOUNTER — Emergency Department: Payer: Self-pay | Admitting: Emergency Medicine

## 2011-08-01 ENCOUNTER — Emergency Department: Payer: Self-pay | Admitting: *Deleted

## 2011-08-01 LAB — URINALYSIS, COMPLETE
Leukocyte Esterase: NEGATIVE
Nitrite: NEGATIVE
Protein: NEGATIVE
RBC,UR: 3 /HPF (ref 0–5)
WBC UR: 20 /HPF (ref 0–5)

## 2011-08-03 ENCOUNTER — Emergency Department: Payer: Self-pay | Admitting: Emergency Medicine

## 2011-08-03 LAB — URINALYSIS, COMPLETE
Bilirubin,UR: NEGATIVE
Glucose,UR: NEGATIVE mg/dL (ref 0–75)
Ketone: NEGATIVE
Leukocyte Esterase: NEGATIVE
Nitrite: NEGATIVE
Ph: 5 (ref 4.5–8.0)
Protein: NEGATIVE
RBC,UR: 4 /HPF (ref 0–5)
Specific Gravity: 1.008 (ref 1.003–1.030)
Squamous Epithelial: 7
WBC UR: 6 /HPF (ref 0–5)

## 2011-08-03 LAB — PREGNANCY, URINE: Pregnancy Test, Urine: NEGATIVE m[IU]/mL

## 2011-08-05 LAB — URINE CULTURE

## 2011-12-04 ENCOUNTER — Emergency Department: Payer: Self-pay | Admitting: Emergency Medicine

## 2011-12-04 LAB — URINALYSIS, COMPLETE
Glucose,UR: NEGATIVE mg/dL (ref 0–75)
Nitrite: NEGATIVE
Protein: NEGATIVE
WBC UR: 2 /HPF (ref 0–5)

## 2011-12-04 LAB — COMPREHENSIVE METABOLIC PANEL
Albumin: 4.2 g/dL (ref 3.4–5.0)
Anion Gap: 9 (ref 7–16)
BUN: 13 mg/dL (ref 7–18)
Bilirubin,Total: 0.3 mg/dL (ref 0.2–1.0)
Chloride: 105 mmol/L (ref 98–107)
Creatinine: 0.77 mg/dL (ref 0.60–1.30)
EGFR (African American): >60
Glucose: 96 mg/dL (ref 65–99)
Osmolality: 274 (ref 275–301)
Potassium: 3.4 mmol/L — ABNORMAL LOW (ref 3.5–5.1)
Sodium: 137 mmol/L (ref 136–145)
Total Protein: 8.7 g/dL — ABNORMAL HIGH (ref 6.4–8.2)

## 2011-12-04 LAB — CBC
HCT: 42.1 % (ref 35.0–47.0)
RDW: 13.4 % (ref 11.5–14.5)
WBC: 6.8 10*3/uL (ref 3.6–11.0)

## 2011-12-04 LAB — WET PREP, GENITAL

## 2011-12-04 LAB — HCG, QUANTITATIVE, PREGNANCY: Beta Hcg, Quant.: <1 m[IU]/mL — ABNORMAL LOW

## 2011-12-06 ENCOUNTER — Emergency Department: Payer: Self-pay | Admitting: Emergency Medicine

## 2012-01-07 ENCOUNTER — Emergency Department: Payer: Self-pay | Admitting: Emergency Medicine

## 2012-12-13 ENCOUNTER — Ambulatory Visit: Payer: Self-pay | Admitting: Family Medicine

## 2013-01-11 ENCOUNTER — Emergency Department: Payer: Self-pay | Admitting: Emergency Medicine

## 2013-01-11 LAB — BASIC METABOLIC PANEL
Anion Gap: 7 (ref 7–16)
BUN: 16 mg/dL (ref 7–18)
Calcium, Total: 8.7 mg/dL (ref 8.5–10.1)
Co2: 25 mmol/L (ref 21–32)
EGFR (Non-African Amer.): >60
Osmolality: 273 (ref 275–301)
Potassium: 3.4 mmol/L — ABNORMAL LOW (ref 3.5–5.1)

## 2013-01-11 LAB — CBC
MCHC: 34.4 g/dL (ref 32.0–36.0)
MCV: 96 fL (ref 80–100)
RBC: 3.83 10*6/uL (ref 3.80–5.20)
WBC: 12.2 10*3/uL — ABNORMAL HIGH (ref 3.6–11.0)

## 2013-01-11 LAB — TROPONIN I: Troponin-I: <0.02 ng/mL

## 2013-06-12 ENCOUNTER — Emergency Department: Payer: Self-pay | Admitting: Internal Medicine

## 2013-06-14 LAB — BETA STREP CULTURE(ARMC)

## 2013-10-11 ENCOUNTER — Emergency Department: Payer: Self-pay | Admitting: Emergency Medicine

## 2013-10-11 LAB — BASIC METABOLIC PANEL
Anion Gap: 5 — ABNORMAL LOW (ref 7–16)
BUN: 13 mg/dL (ref 7–18)
CALCIUM: 8.9 mg/dL (ref 8.5–10.1)
CREATININE: 0.92 mg/dL (ref 0.60–1.30)
Chloride: 106 mmol/L (ref 98–107)
Co2: 25 mmol/L (ref 21–32)
EGFR (African American): >60
EGFR (Non-African Amer.): >60
GLUCOSE: 76 mg/dL (ref 65–99)
Osmolality: 271 (ref 275–301)
Potassium: 3.7 mmol/L (ref 3.5–5.1)
SODIUM: 136 mmol/L (ref 136–145)

## 2013-10-11 LAB — CBC
HCT: 40.3 % (ref 35.0–47.0)
HGB: 13.4 g/dL (ref 12.0–16.0)
MCH: 31.5 pg (ref 26.0–34.0)
MCHC: 33.1 g/dL (ref 32.0–36.0)
MCV: 95 fL (ref 80–100)
PLATELETS: 338 10*3/uL (ref 150–440)
RBC: 4.24 10*6/uL (ref 3.80–5.20)
RDW: 14.6 % — AB (ref 11.5–14.5)
WBC: 5.4 10*3/uL (ref 3.6–11.0)

## 2013-10-11 LAB — PRO B NATRIURETIC PEPTIDE: B-TYPE NATIURETIC PEPTID: 58 pg/mL (ref 0–125)

## 2013-10-11 LAB — TROPONIN I: Troponin-I: <0.02 ng/mL

## 2013-11-17 ENCOUNTER — Emergency Department: Payer: Self-pay | Admitting: Emergency Medicine

## 2013-11-26 ENCOUNTER — Emergency Department: Payer: Self-pay | Admitting: Internal Medicine

## 2014-05-13 ENCOUNTER — Emergency Department
Admit: 2014-05-13 | Disposition: A | Discharge status: Home / Self Care | Payer: Self-pay | Admitting: Emergency Medicine

## 2015-01-22 ENCOUNTER — Emergency Department
Admission: EM | Admit: 2015-01-22 | Discharge: 2015-01-22 | Disposition: A | Discharge status: Home / Self Care | Payer: Medicaid Other | Attending: Emergency Medicine | Admitting: Emergency Medicine

## 2015-01-22 ENCOUNTER — Emergency Department: Payer: Medicaid Other

## 2015-01-22 ENCOUNTER — Encounter: Payer: Self-pay | Admitting: Emergency Medicine

## 2015-01-22 DIAGNOSIS — N83202 Unspecified ovarian cyst, left side: Secondary | ICD-10-CM | POA: Diagnosis not present

## 2015-01-22 DIAGNOSIS — H9202 Otalgia, left ear: Secondary | ICD-10-CM | POA: Diagnosis not present

## 2015-01-22 DIAGNOSIS — F17210 Nicotine dependence, cigarettes, uncomplicated: Secondary | ICD-10-CM | POA: Diagnosis not present

## 2015-01-22 DIAGNOSIS — Z3202 Encounter for pregnancy test, result negative: Secondary | ICD-10-CM | POA: Insufficient documentation

## 2015-01-22 DIAGNOSIS — J40 Bronchitis, not specified as acute or chronic: Secondary | ICD-10-CM | POA: Insufficient documentation

## 2015-01-22 DIAGNOSIS — Z79899 Other long term (current) drug therapy: Secondary | ICD-10-CM | POA: Insufficient documentation

## 2015-01-22 DIAGNOSIS — I10 Essential (primary) hypertension: Secondary | ICD-10-CM | POA: Insufficient documentation

## 2015-01-22 DIAGNOSIS — R102 Pelvic and perineal pain: Secondary | ICD-10-CM

## 2015-01-22 DIAGNOSIS — R52 Pain, unspecified: Secondary | ICD-10-CM

## 2015-01-22 DIAGNOSIS — R1032 Left lower quadrant pain: Secondary | ICD-10-CM | POA: Diagnosis present

## 2015-01-22 DIAGNOSIS — N83209 Unspecified ovarian cyst, unspecified side: Secondary | ICD-10-CM

## 2015-01-22 HISTORY — DX: Anxiety disorder, unspecified: F41.9

## 2015-01-22 HISTORY — DX: Essential (primary) hypertension: I10

## 2015-01-22 LAB — CBC WITH DIFFERENTIAL/PLATELET
BASOS ABS: 0 10*3/uL (ref 0–0.1)
Basophils Relative: 1 %
EOS PCT: 3 %
Eosinophils Absolute: 0.2 10*3/uL (ref 0–0.7)
HCT: 40.5 % (ref 35.0–47.0)
HEMOGLOBIN: 13.6 g/dL (ref 12.0–16.0)
LYMPHS ABS: 2.3 10*3/uL (ref 1.0–3.6)
LYMPHS PCT: 39 %
MCH: 29.4 pg (ref 26.0–34.0)
MCHC: 33.6 g/dL (ref 32.0–36.0)
MCV: 87.5 fL (ref 80.0–100.0)
MONO ABS: 0.4 10*3/uL (ref 0.2–0.9)
MONOS PCT: 7 %
Neutro Abs: 3 10*3/uL (ref 1.4–6.5)
Neutrophils Relative %: 50 %
Platelets: 354 10*3/uL (ref 150–440)
RBC: 4.63 MIL/uL (ref 3.80–5.20)
RDW: 12.5 % (ref 11.5–14.5)
WBC: 6 10*3/uL (ref 3.6–11.0)

## 2015-01-22 LAB — WET PREP, GENITAL
Clue Cells Wet Prep HPF POC: NONE SEEN
SPERM: NONE SEEN
Trich, Wet Prep: NONE SEEN
Yeast Wet Prep HPF POC: NONE SEEN

## 2015-01-22 LAB — COMPREHENSIVE METABOLIC PANEL
ALBUMIN: 4.6 g/dL (ref 3.5–5.0)
ALK PHOS: 73 U/L (ref 38–126)
ALT: 17 U/L (ref 14–54)
AST: 18 U/L (ref 15–41)
Anion gap: 7 (ref 5–15)
BUN: 17 mg/dL (ref 6–20)
CALCIUM: 9.3 mg/dL (ref 8.9–10.3)
CO2: 27 mmol/L (ref 22–32)
CREATININE: 0.75 mg/dL (ref 0.44–1.00)
Chloride: 102 mmol/L (ref 101–111)
GFR calc Af Amer: >60 mL/min (ref >60)
GFR calc non Af Amer: >60 mL/min (ref >60)
GLUCOSE: 106 mg/dL — AB (ref 65–99)
Potassium: 4.1 mmol/L (ref 3.5–5.1)
SODIUM: 136 mmol/L (ref 135–145)
Total Bilirubin: 0.5 mg/dL (ref 0.3–1.2)
Total Protein: 7.8 g/dL (ref 6.5–8.1)

## 2015-01-22 LAB — URINALYSIS COMPLETE WITH MICROSCOPIC (ARMC ONLY)
Bilirubin Urine: NEGATIVE
Glucose, UA: NEGATIVE mg/dL
KETONES UR: NEGATIVE mg/dL
Leukocytes, UA: NEGATIVE
Nitrite: NEGATIVE
PROTEIN: NEGATIVE mg/dL
SPECIFIC GRAVITY, URINE: 1.003 — AB (ref 1.005–1.030)
pH: 6 (ref 5.0–8.0)

## 2015-01-22 LAB — CHLAMYDIA/NGC RT PCR (ARMC ONLY)
CHLAMYDIA TR: NOT DETECTED
N GONORRHOEAE: NOT DETECTED

## 2015-01-22 LAB — POCT PREGNANCY, URINE: Preg Test, Ur: NEGATIVE

## 2015-01-22 LAB — LIPASE, BLOOD: Lipase: 23 U/L (ref 11–51)

## 2015-01-22 NOTE — Discharge Instructions (Signed)
You have a cyst on the left ovary. This may be hemorrhagic (filled with some blood). This will likely resolve on its own. Follow-up with gynecology for further evaluation. Return to the emergency department if you have worsening pain, general weakness, or other urgent concerns.

## 2015-01-22 NOTE — ED Notes (Signed)
Epigastric,  left lower abdominal and left lower back pain, left ear pain.  Onset of symptoms Monday.  Has taken Zyrtec, Flonase, Robitussin DM, Monistat 7.    Also c/o urinary frequency.

## 2015-01-22 NOTE — ED Provider Notes (Signed)
[facility] Emergency Department Provider Note  ____________________________________________  Time seen: 1225  I have reviewed the triage vital signs and the nursing notes.  History by:  Patient  HISTORY  Chief Complaint Abdominal Pain     HPI Brooke Hogan is a 39 y.o. female who reports she has been having discomfort in her abdomen for the past 3-4 days. This is primarily been in the left lower quadrant. She also reports she has had some vaginal discharge. She thought she might have a yeast infection and used Monistat. Despite this, the pain has continued. She overall looks comfortable. She denies any diarrhea. She does report she thinks she might be a little constipated, going from 3 bowel movements a day usually, to one bowel movement per day. She denies any nausea or vomiting.  Patient also reports that she is having some discomfort in her left lateral neck. She feels her ear has some discomfort as well. She denies any strain or trauma to the area.  She denies any fever. She does reports she's had a cough.     Past Medical History  Diagnosis Date  . Hypertension   . Anxiety     There are no active problems to display for this patient.   History reviewed. No pertinent past surgical history.  Current Outpatient Rx  Name  Route  Sig  Dispense  Refill  . atenolol (TENORMIN) 50 MG tablet   Oral   Take 50 mg by mouth daily.         Marland Kitchen FLUoxetine (PROZAC) 20 MG capsule   Oral   Take 20 mg by mouth daily.         . fluticasone (FLONASE) 50 MCG/ACT nasal spray   Each Nare   Place 1 spray into both nostrils daily.         Marland Kitchen lisinopril (PRINIVIL,ZESTRIL) 20 MG tablet   Oral   Take 20 mg by mouth daily.           Allergies Review of patient's allergies indicates no known allergies.  No family history on file.  Social History Social History  Substance Use Topics  . Smoking status: Current Every Day Smoker -- 0.50 packs/day    Types: Cigarettes  . Smokeless tobacco: Never Used  . Alcohol Use: No    Review of Systems  Constitutional: Negative for fever/chills. ENT: Some discomfort in left ear. See history of present illness Cardiovascular: Negative for chest pain. Respiratory: "Bronchitis", with cough. See history of present illness. Gastrointestinal: Positive for abdominal pain. See history of present illness. No vomiting and diarrhea. Genitourinary: Negative for dysuria. Patient does report some vaginal discharge and concern for a yeast infection. She is Monistat. Musculoskeletal: No back pain. Skin: Negative for rash. Neurological: Negative for headache or focal weakness   10-point ROS otherwise negative.  ____________________________________________   PHYSICAL EXAM:  VITAL SIGNS: ED Triage Vitals  Enc Vitals Group     BP 01/22/15 1112 131/88 mmHg     Pulse Rate 01/22/15 1112 69     Resp 01/22/15 1112 15     Temp 01/22/15 1112 98.7 F (37.1 C)     Temp Source 01/22/15 1112 Oral     SpO2 01/22/15 1112 100 %     Weight 01/22/15 1112 155 lb (70.308 kg)     Height 01/22/15 1112  (1.499 m)     Head Cir --      Peak Flow --      Pain  Score 01/22/15 1113 8     Pain Loc --      Pain Edu? --      Excl. in GC? --     Constitutional: Alert and oriented. Well appearing and in no distress. ENT   Head: Normocephalic and atraumatic.   Nose: No congestion/rhinnorhea.       Mouth: No erythema, no swelling        Ear: Normal air exam with clear canal and normal tympanic membrane. Cardiovascular: Normal rate, regular rhythm, no murmur noted Respiratory:  Normal respiratory effort, no tachypnea.    Breath sounds are clear and equal bilaterally.  Gastrointestinal: Soft, no distention. Mild tenderness in the left lower quadrant. Back: No muscle spasm, no tenderness, no CVA tenderness. Musculoskeletal: No deformity noted. Nontender with normal range of motion in all extremities.  No noted  edema. Neurologic:  Communicative. Normal appearing spontaneous movement in all 4 extremities. No gross focal neurologic deficits are appreciated.  Skin:  Skin is warm, dry. No rash noted. Psychiatric: Mood and affect are normal. Speech and behavior are normal.   ----------------------------------------- 1:36 PM on 01/22/2015 -----------------------------------------  Pelvic exam: Normal external female genitalia. Normal physiologic discharge. No cervical motion tenderness. No blood. Minimal tenderness in the left adnexa. ____________________________________________    LABS (pertinent positives/negatives)  Labs Reviewed  WET PREP, GENITAL - Abnormal; Notable for the following:    WBC, Wet Prep HPF POC FEW (*)    All other components within normal limits  URINALYSIS COMPLETEWITH MICROSCOPIC ([facility] ONLY) - Abnormal; Notable for the following:    Color, Urine STRAW (*)    APPearance HAZY (*)    Specific Gravity, Urine 1.003 (*)    Hgb urine dipstick 1+ (*)    Bacteria, UA MANY (*)    Squamous Epithelial / LPF 6-30 (*)    All other components within normal limits  COMPREHENSIVE METABOLIC PANEL - Abnormal; Notable for the following:    Glucose, Bld 106 (*)    All other components within normal limits  CHLAMYDIA/NGC RT PCR ([facility] ONLY)  CBC WITH DIFFERENTIAL/PLATELET  LIPASE, BLOOD  POC URINE PREG, ED  POCT PREGNANCY, URINE     ____________________________________________   Radiology:  Pelvic ultrasound: IMPRESSION: 1. Complex cystic structure in the left ovary is favored to represent hemorrhagic cyst. Short-interval follow up ultrasound in 6-12 weeks is recommended, preferably during the week following the patient's normal menses.  ____________________________________________   INITIAL IMPRESSION / ASSESSMENT AND PLAN / ED COURSE  Pertinent labs & imaging results that were available during my care of the patient were reviewed by me and considered in my  medical decision making (see chart for details).  Well-appearing 39 year old female in no acute distress. She does have some mild discomfort in the left lower quadrant. It is unclear if this is gastrointestinal or reproductive. She does report having a bit of vaginal discharge. We will do a pelvic exam for further evaluation.  Of note, her blood tests all look quite reasonable. She does not have urinary tract infection. She has 0-5 white blood cells per field. She does have many bacteria noted but with 6-30 epithelial cells as well. Negative for leukocyte esterase and nitrites.  ----------------------------------------- 1:38 PM on 01/22/2015 -----------------------------------------  Pelvic exam showed mild tenderness in the left adnexa. The patient then told me that Monday and Tuesday she had begun new exercises that involved crunches and other abdominal work. She is wondering if that caused her pain. Overall, my impression is that that is  not what is causing this pain. She does have tenderness on exam of the adnexa. We will perform an ultrasound of the pelvic area.  ----------------------------------------- 3:39 PM on 01/22/2015 -----------------------------------------  The ultrasound shows a complex cystic structure in the left ovary. It is suspected this is a hemorrhagic cyst.   Patient overall appears comfortable. We'll discharge her with further information about her ultrasound and referral to gynecology for follow-up.  ____________________________________________   FINAL CLINICAL IMPRESSION(S) / ED DIAGNOSES  Final diagnoses:  Pelvic pain in female  Ovarian cyst, left  Hemorrhagic cyst of ovary      Darien Ramusavid W Saturnino Liew, MD 01/22/15 [phone]

## 2015-01-22 NOTE — ED Notes (Signed)
Returned from U/S

## 2015-01-22 NOTE — ED Notes (Signed)
Pt states she has been fighting bronchitis and having chest pain from coughing.

## 2016-03-17 ENCOUNTER — Encounter: Payer: Self-pay | Admitting: Emergency Medicine

## 2016-03-17 ENCOUNTER — Emergency Department
Admission: EM | Admit: 2016-03-17 | Discharge: 2016-03-17 | Disposition: A | Discharge status: Home / Self Care | Payer: Medicaid Other | Attending: Emergency Medicine | Admitting: Emergency Medicine

## 2016-03-17 DIAGNOSIS — I10 Essential (primary) hypertension: Secondary | ICD-10-CM | POA: Insufficient documentation

## 2016-03-17 DIAGNOSIS — F419 Anxiety disorder, unspecified: Secondary | ICD-10-CM | POA: Insufficient documentation

## 2016-03-17 DIAGNOSIS — F17210 Nicotine dependence, cigarettes, uncomplicated: Secondary | ICD-10-CM | POA: Insufficient documentation

## 2016-03-17 DIAGNOSIS — Z79899 Other long term (current) drug therapy: Secondary | ICD-10-CM | POA: Insufficient documentation

## 2016-03-17 DIAGNOSIS — J0100 Acute maxillary sinusitis, unspecified: Secondary | ICD-10-CM

## 2016-03-17 MED ORDER — AMOXICILLIN 500 MG PO CAPS: 500.0000 mg | ORAL_CAPSULE | Freq: Three times a day (TID) | ORAL | 0 refills | Status: DC

## 2016-03-17 MED ORDER — FEXOFENADINE-PSEUDOEPHED ER 60-120 MG PO TB12: 1.0000 | ORAL_TABLET | Freq: Two times a day (BID) | ORAL | 0 refills | Status: DC

## 2016-03-17 NOTE — ED Triage Notes (Signed)
Headaches and sinus congestion x 2 weeks, yellow drainage.

## 2016-03-17 NOTE — ED Provider Notes (Signed)
[facility] Emergency Department Provider Note   ____________________________________________   None    (approximate)  I have reviewed the triage vital signs and the nursing notes.   HISTORY  Chief Complaint Facial Pain    HPI Brooke Hogan is a 41 y.o. female patient complaining headache and sinus congestion 2 weeks. Patient states she has a green/yellowish nasal discharge. Patient state cough secondary to postnasal drainage. Patient denies  fevers this complaint. Patient rates the pain as 8/10. Patient Pain is "pressure". Patient transient relief over-the-counter cough medications.   Past Medical History:  Diagnosis Date  . Anxiety   . Hypertension     There are no active problems to display for this patient.   History reviewed. No pertinent surgical history.  Prior to Admission medications   Medication Sig Start Date End Date Taking? Authorizing Provider  amoxicillin (AMOXIL) 500 MG capsule Take 1 capsule (500 mg total) by mouth 3 (three) times daily. 03/17/16   Joni Reiningonald K Kriston Pasquarello, PA-C  atenolol (TENORMIN) 50 MG tablet Take 50 mg by mouth daily.    Historical Provider, MD  fexofenadine-pseudoephedrine (ALLEGRA-D) 60-120 MG 12 hr tablet Take 1 tablet by mouth 2 (two) times daily. 03/17/16   Joni Reiningonald K Myrth Dahan, PA-C  FLUoxetine (PROZAC) 20 MG capsule Take 20 mg by mouth daily.    Historical Provider, MD  fluticasone (FLONASE) 50 MCG/ACT nasal spray Place 1 spray into both nostrils daily.    Historical Provider, MD  lisinopril (PRINIVIL,ZESTRIL) 20 MG tablet Take 20 mg by mouth daily.    Historical Provider, MD    Allergies Patient has no known allergies.  No family history on file.  Social History Social History  Substance Use Topics  . Smoking status: Current Every Day Smoker    Packs/day: 0.50    Types: Cigarettes  . Smokeless tobacco: Never Used  . Alcohol use No    Review of Systems Constitutional: No fever/chills Eyes: No visual  changes. ENT: No sore throat. Cardiovascular: Denies chest pain. Respiratory: Denies shortness of breath. Gastrointestinal: No abdominal pain.  No nausea, no vomiting.  No diarrhea.  No constipation. Genitourinary: Negative for dysuria. Musculoskeletal: Negative for back pain. Skin: Negative for rash. Neurological: Negative for headaches, focal weakness or numbness. Psychiatric:Anxiety Endocrine:Hypertension   ____________________________________________   PHYSICAL EXAM:  VITAL SIGNS: ED Triage Vitals  Enc Vitals Group     BP 03/17/16 0912 122/75     Pulse Rate 03/17/16 0912 84     Resp 03/17/16 0912 18     Temp 03/17/16 0912 98.3 F (36.8 C)     Temp Source 03/17/16 0912 Oral     SpO2 03/17/16 0912 98 %     Weight 03/17/16 0914 142 lb (64.4 kg)     Height 03/17/16 0914 5' (1.524 m)     Head Circumference --      Peak Flow --      Pain Score 03/17/16 0914 8     Pain Loc --      Pain Edu? --      Excl. in GC? --     Constitutional: Alert and oriented. Well appearing and in no acute distress. Eyes: Conjunctivae are normal. PERRL. EOMI. Head: Atraumatic. Nose: Edematous nasal turbinates. Greenish nasal discharge. Bilateral maxillary guarding. Mouth/Throat: Mucous membranes are moist.  Oropharynx non-erythematous. Postnasal drainage Neck: No stridor. No cervical spine tenderness to palpation. Hematological/Lymphatic/Immunilogical: No cervical lymphadenopathy. Cardiovascular: Normal rate, regular rhythm. Grossly normal heart sounds.  Good peripheral circulation.  Respiratory: Normal respiratory effort.  No retractions. Lungs CTAB. Gastrointestinal: Soft and nontender. No distention. No abdominal bruits. No CVA tenderness. Musculoskeletal: No lower extremity tenderness nor edema.  No joint effusions. Neurologic:  Normal speech and language. No gross focal neurologic deficits are appreciated. No gait instability. Skin:  Skin is warm, dry and intact. No rash  noted. Psychiatric: Mood and affect are normal. Speech and behavior are normal.  ____________________________________________   LABS (all labs ordered are listed, but only abnormal results are displayed)  Labs Reviewed - No data to display ____________________________________________  EKG   ____________________________________________  RADIOLOGY   ____________________________________________   PROCEDURES  Procedure(s) performed: None  Procedures  Critical Care performed: No  ____________________________________________   INITIAL IMPRESSION / ASSESSMENT AND PLAN / ED COURSE  Pertinent labs & imaging results that were available during my care of the patient were reviewed by me and considered in my medical decision making (see chart for details).  Maxillary sinusitis. Patient given discharge care instructions. Patient given a prescription for amoxicillin and Allegra-D. Patient advised follow-up family doctor improved within 5-7 days.      ____________________________________________   FINAL CLINICAL IMPRESSION(S) / ED DIAGNOSES  Final diagnoses:  Subacute maxillary sinusitis      NEW MEDICATIONS STARTED DURING THIS VISIT:  New Prescriptions   AMOXICILLIN (AMOXIL) 500 MG CAPSULE    Take 1 capsule (500 mg total) by mouth 3 (three) times daily.   FEXOFENADINE-PSEUDOEPHEDRINE (ALLEGRA-D) 60-120 MG 12 HR TABLET    Take 1 tablet by mouth 2 (two) times daily.     Note:  This document was prepared using Dragon voice recognition software and may include unintentional dictation errors.    Joni Reining, PA-C 03/17/16 1022    Sharman Cheek, MD 03/18/16 [phone]

## 2016-04-17 ENCOUNTER — Emergency Department: Payer: Self-pay

## 2016-04-17 ENCOUNTER — Encounter: Payer: Self-pay | Admitting: Emergency Medicine

## 2016-04-17 ENCOUNTER — Emergency Department
Admission: EM | Admit: 2016-04-17 | Discharge: 2016-04-17 | Discharge status: Against Medical Advice | Payer: Self-pay | Attending: Emergency Medicine | Admitting: Emergency Medicine

## 2016-04-17 DIAGNOSIS — R002 Palpitations: Secondary | ICD-10-CM | POA: Insufficient documentation

## 2016-04-17 DIAGNOSIS — J029 Acute pharyngitis, unspecified: Secondary | ICD-10-CM | POA: Insufficient documentation

## 2016-04-17 DIAGNOSIS — R0602 Shortness of breath: Secondary | ICD-10-CM | POA: Insufficient documentation

## 2016-04-17 DIAGNOSIS — I10 Essential (primary) hypertension: Secondary | ICD-10-CM | POA: Insufficient documentation

## 2016-04-17 DIAGNOSIS — F17210 Nicotine dependence, cigarettes, uncomplicated: Secondary | ICD-10-CM | POA: Insufficient documentation

## 2016-04-17 DIAGNOSIS — R079 Chest pain, unspecified: Secondary | ICD-10-CM | POA: Insufficient documentation

## 2016-04-17 DIAGNOSIS — Z79899 Other long term (current) drug therapy: Secondary | ICD-10-CM | POA: Insufficient documentation

## 2016-04-17 LAB — BASIC METABOLIC PANEL
Anion gap: 6 (ref 5–15)
BUN: 21 mg/dL — AB (ref 6–20)
CHLORIDE: 106 mmol/L (ref 101–111)
CO2: 24 mmol/L (ref 22–32)
CREATININE: 0.89 mg/dL (ref 0.44–1.00)
Calcium: 9.1 mg/dL (ref 8.9–10.3)
GFR calc Af Amer: >60 mL/min (ref >60)
GFR calc non Af Amer: >60 mL/min (ref >60)
GLUCOSE: 87 mg/dL (ref 65–99)
POTASSIUM: 3.9 mmol/L (ref 3.5–5.1)
SODIUM: 136 mmol/L (ref 135–145)

## 2016-04-17 LAB — CBC
HCT: 35.5 % (ref 35.0–47.0)
Hemoglobin: 12.2 g/dL (ref 12.0–16.0)
MCH: 30.7 pg (ref 26.0–34.0)
MCHC: 34.5 g/dL (ref 32.0–36.0)
MCV: 89 fL (ref 80.0–100.0)
Platelets: 309 10*3/uL (ref 150–440)
RBC: 3.99 MIL/uL (ref 3.80–5.20)
RDW: 13.5 % (ref 11.5–14.5)
WBC: 3.9 10*3/uL (ref 3.6–11.0)

## 2016-04-17 LAB — TROPONIN I: Troponin I: <0.03 ng/mL (ref <0.03)

## 2016-04-17 LAB — POCT PREGNANCY, URINE: Preg Test, Ur: NEGATIVE

## 2016-04-17 MED ORDER — ASPIRIN 81 MG PO CHEW
324.0000 mg | CHEWABLE_TABLET | Freq: Once | ORAL | Status: AC
  Administered 2016-04-17: 324 mg via ORAL
  Filled 2016-04-17: qty 4

## 2016-04-17 NOTE — ED Triage Notes (Signed)
Pt to ed with c/o chest pain that is intermittent, worse with palpation, sob since Sunday.  Pt states she also feels weak and tired.

## 2016-04-17 NOTE — Discharge Instructions (Signed)
You may take Tylenol or Motrin for your sore throat. Plenty of fluid to stay well-hydrated.  For your chest pain, please make an appointment for an outpatient stress test with Dr. Waldron LabsKawalski.  Return to the emergency department if you develop severe pain, shortness of breath, palpitations, lightheadedness or fainting, difficulty swallowing, or any other symptoms concerning to you.

## 2016-04-17 NOTE — ED Notes (Signed)
Pt ambulatory to toilet to collect urine sample.  

## 2016-04-17 NOTE — ED Provider Notes (Signed)
[facility] Emergency Department Provider Note  ____________________________________________  Time seen: Approximately 11:31 AM  I have reviewed the triage vital signs and the nursing notes.   HISTORY  Chief Complaint Chest Pain    HPI Brooke Hogan is a 41 y.o. female with a history of HTN, ongoing tobacco abuse, presenting with chest pain, sore throat. The patient reports that since Saturday, she has had several daily episodes of exertional chest pain. She describes a sharp pain in the left chest that is reproducible with pushing on it, and only noticeable when she is exerting herself. "I am always moving around." If she rests it goes away. However, it is also associated with palpitations and shortness of breath. No associated lightheadedness, diaphoresis, or syncope. She is not having any pain at this time. Over the past several days, the patient has also noted a sore throat without ear pain, cough, congestion or rhinorrhea. The patient has had fevers to 102. Sick contacts. The patient has not had any cardiac risk certification studies over the last 12 months.  FH: no early CAD SH: + tobacco, no cocaine   Past Medical History:  Diagnosis Date  . Anxiety   . Hypertension     There are no active problems to display for this patient.   History reviewed. No pertinent surgical history.  Current Outpatient Rx  . Order #: [id number] Class: Historical Med  . Order #: [id number] Class: Historical Med  . Order #: [id number] Class: Historical Med  . Order #: [id number] Class: Historical Med  . Order #: [id number] Class: Print  . Order #: [id number] Class: Print    Allergies Patient has no known allergies.  History reviewed. No pertinent family history.  Social History Social History  Substance Use Topics  . Smoking status: Current Every Day Smoker    Packs/day: 0.50    Types: Cigarettes  . Smokeless tobacco: Never Used  . Alcohol use No    Review of  Systems Constitutional: No fever/chills.No lightheadedness or syncope. No diaphoresis  Eyes: No visual changes. ENT: Positive sore throat. No congestion or rhinorrhea. No ear pain Cardiovascular: Positive chest pain. Positive palpitations. Respiratory: Positive shortness of breath.  No cough. Gastrointestinal: No abdominal pain.  No nausea, no vomiting.  No diarrhea.  No constipation. Genitourinary: Negative for dysuria. Musculoskeletal: Negative for back pain. Skin: Negative for rash. Neurological: Negative for headaches. No focal numbness, tingling or weakness.   10-point ROS otherwise negative.  ____________________________________________   PHYSICAL EXAM:  VITAL SIGNS: ED Triage Vitals  Enc Vitals Group     BP 04/17/16 0931 128/86     Pulse Rate 04/17/16 0931 67     Resp 04/17/16 0931 16     Temp 04/17/16 0931 98.5 F (36.9 C)     Temp Source 04/17/16 0931 Oral     SpO2 04/17/16 0931 100 %     Weight 04/17/16 0924 160 lb (72.6 kg)     Height 04/17/16 0924 4\' 11"  (1.499 m)     Head Circumference --      Peak Flow --      Pain Score 04/17/16 0925 8     Pain Loc --      Pain Edu? --      Excl. in GC? --     Constitutional: Alert and oriented. Well appearing and in no acute distress. Answers questions appropriately. Eyes: Conjunctivae are normal.  EOMI. No scleral icterus. Head: Atraumatic. Nose: No congestion/rhinnorhea. Mouth/Throat: Mucous membranes are moist.  No posterior pharyngeal erythema,  or tonsillar swelling. She has 1 spot tonsillar exudate on the right tonsil without erythema or swelling. They posterior palate is symmetric uvula is midline. EARS: Left TM is obscured by cerumen. Right TM is clear without fluid bulge or erythema. The canals are clear bilaterally. Neck: No stridor.  Supple.  No JVD. No meningismus. Cardiovascular: Normal rate, regular rhythm. No murmurs, rubs or gallops.  Respiratory: Normal respiratory effort.  No accessory muscle use or  retractions. Lungs CTAB.  No wheezes, rales or ronchi. Gastrointestinal: Soft, nontender and nondistended.  No guarding or rebound.  No peritoneal signs. Musculoskeletal: No LE edema. No ttp in the calves or palpable cords.  Negative Homan's sign. Neurologic:  A&Ox3.  Speech is clear.  Face and smile are symmetric.  EOMI.  Moves all extremities well. Skin:  Skin is warm, dry and intact. No rash noted. Psychiatric: Mood and affect are normal. Speech and behavior are normal.  Normal judgement.  ____________________________________________   LABS (all labs ordered are listed, but only abnormal results are displayed)  Labs Reviewed  BASIC METABOLIC PANEL - Abnormal; Notable for the following:       Result Value   BUN 21 (*)    All other components within normal limits  CBC  TROPONIN I  POC URINE PREG, ED  POCT PREGNANCY, URINE   ____________________________________________  EKG  ED ECG REPORT I, Rockne Menghini, the attending physician, personally viewed and interpreted this ECG.   Date: 04/17/2016  EKG Time: 927  Rate: 75  Rhythm: normal sinus rhythm  Axis: normal  Intervals:none  ST&T Change: Nonspecific T-wave inversion in V1. No ST elevation.  ____________________________________________  RADIOLOGY  Dg Chest 2 View  Result Date: 04/17/2016 CLINICAL DATA:  Chest pain. EXAM: CHEST  2 VIEW COMPARISON:  Radiographs of July 31, 2014. FINDINGS: The heart size and mediastinal contours are within normal limits. Both lungs are clear. No pneumothorax or pleural effusion is noted. The visualized skeletal structures are unremarkable. IMPRESSION: No active cardiopulmonary disease. Electronically Signed   By: Lupita Raider, M.D.   On: 04/17/2016 10:15    ____________________________________________   PROCEDURES  Procedure(s) performed: None  Procedures  Critical Care performed: No ____________________________________________   INITIAL IMPRESSION / ASSESSMENT AND  PLAN / ED COURSE  Pertinent labs & imaging results that were available during my care of the patient were reviewed by me and considered in my medical decision making (see chart for details).  41 y.o. female with hypertension and ongoing tobacco abuse presenting with chest pain and sore throat. Overall, the patient's physical examination is not suggestive of strep pharyngitis, although she may have a viral syndrome or viral pharyngitis. The treatment will be symptomatic. No antibiotics are indicated at this time.  For the patient's chest pain, I am concerned about her risk factors of hypertension and tobacco abuse, as well as exertional nature of her pain. It is possible this is musculoskeletal given that she says she is very active and does have pain on my examination. However, given the other associated symptoms of shortness of breath and palpitations, my recommendation is for her to stay at the hospital for complete cardiac evaluation and risk stratification study. At this time, the patient states that she is unwilling to stay and we have discussed the risks of discharge without complete evaluation. I'll plan to reevaluate her after her tests return.  ----------------------------------------- 11:37 AM on 04/17/2016 -----------------------------------------  The patient's EKG does not show ischemic changes, her troponin is negative and her  chest x-ray shows no acute cardiopulmonary process. She is hemodynamically stable. At this time, the patient does not wish to stay for further evaluation or treatment. We have again discussed the risks of leaving, including death, but I will plan to talk to cardiology for an outpatient stress test per [facility]'s return precautions as well as follow-up instructions.  ____________________________________________  FINAL CLINICAL IMPRESSION(S) / ED DIAGNOSES  Final diagnoses:  Chest pain, unspecified type  Palpitations  Shortness of breath  Sore throat          NEW MEDICATIONS STARTED DURING THIS VISIT:  New Prescriptions   No medications on file      Rockne MenghiniAnne-Caroline Zaidy Absher, MD 04/17/16 1143

## 2016-10-07 ENCOUNTER — Encounter: Payer: Self-pay | Admitting: Emergency Medicine

## 2016-10-07 DIAGNOSIS — F17210 Nicotine dependence, cigarettes, uncomplicated: Secondary | ICD-10-CM | POA: Diagnosis not present

## 2016-10-07 DIAGNOSIS — M940 Chondrocostal junction syndrome [Tietze]: Secondary | ICD-10-CM | POA: Diagnosis not present

## 2016-10-07 DIAGNOSIS — J40 Bronchitis, not specified as acute or chronic: Secondary | ICD-10-CM | POA: Insufficient documentation

## 2016-10-07 DIAGNOSIS — E871 Hypo-osmolality and hyponatremia: Secondary | ICD-10-CM | POA: Diagnosis not present

## 2016-10-07 DIAGNOSIS — Z79899 Other long term (current) drug therapy: Secondary | ICD-10-CM | POA: Insufficient documentation

## 2016-10-07 DIAGNOSIS — R079 Chest pain, unspecified: Secondary | ICD-10-CM | POA: Diagnosis present

## 2016-10-07 DIAGNOSIS — I10 Essential (primary) hypertension: Secondary | ICD-10-CM | POA: Diagnosis not present

## 2016-10-07 LAB — CBC
HEMATOCRIT: 37.1 % (ref 35.0–47.0)
Hemoglobin: 13.3 g/dL (ref 12.0–16.0)
MCH: 31.5 pg (ref 26.0–34.0)
MCHC: 35.8 g/dL (ref 32.0–36.0)
MCV: 88.2 fL (ref 80.0–100.0)
Platelets: 235 10*3/uL (ref 150–440)
RBC: 4.2 MIL/uL (ref 3.80–5.20)
RDW: 12.6 % (ref 11.5–14.5)
WBC: 4.7 10*3/uL (ref 3.6–11.0)

## 2016-10-07 NOTE — ED Triage Notes (Addendum)
Pt reports cold symptoms for 2 days, nonproductive cough, sinus congestion; afebrile; pt reports now with palpable pain to left chest area that radiates through to her back; short of breath with exertion; has taken Mucinex with some relief when symptoms first started, but not now; pt talking in complete coherent sentences; lungs clear at this time

## 2016-10-08 ENCOUNTER — Emergency Department: Payer: Medicaid Other

## 2016-10-08 ENCOUNTER — Emergency Department
Admission: EM | Admit: 2016-10-08 | Discharge: 2016-10-08 | Disposition: A | Discharge status: Home / Self Care | Payer: Medicaid Other | Attending: Emergency Medicine | Admitting: Emergency Medicine

## 2016-10-08 DIAGNOSIS — M940 Chondrocostal junction syndrome [Tietze]: Secondary | ICD-10-CM

## 2016-10-08 DIAGNOSIS — E871 Hypo-osmolality and hyponatremia: Secondary | ICD-10-CM

## 2016-10-08 DIAGNOSIS — R079 Chest pain, unspecified: Secondary | ICD-10-CM

## 2016-10-08 DIAGNOSIS — J40 Bronchitis, not specified as acute or chronic: Secondary | ICD-10-CM

## 2016-10-08 LAB — BASIC METABOLIC PANEL
Anion gap: 10 (ref 5–15)
BUN: 12 mg/dL (ref 6–20)
CHLORIDE: 91 mmol/L — AB (ref 101–111)
CO2: 26 mmol/L (ref 22–32)
CREATININE: 0.82 mg/dL (ref 0.44–1.00)
Calcium: 8.8 mg/dL — ABNORMAL LOW (ref 8.9–10.3)
GFR calc Af Amer: >60 mL/min (ref >60)
GFR calc non Af Amer: >60 mL/min (ref >60)
GLUCOSE: 89 mg/dL (ref 65–99)
POTASSIUM: 3 mmol/L — AB (ref 3.5–5.1)
SODIUM: 127 mmol/L — AB (ref 135–145)

## 2016-10-08 LAB — TROPONIN I: Troponin I: <0.03 ng/mL (ref <0.03)

## 2016-10-08 MED ORDER — PREDNISONE 20 MG PO TABS: 60.0000 mg | ORAL_TABLET | Freq: Every day | ORAL | 0 refills | Status: DC

## 2016-10-08 MED ORDER — GI COCKTAIL ~~LOC~~
30.0000 mL | Freq: Once | ORAL | Status: AC
  Administered 2016-10-08: 30 mL via ORAL
  Filled 2016-10-08: qty 30

## 2016-10-08 MED ORDER — AZITHROMYCIN 250 MG PO TABS: ORAL_TABLET | ORAL | 0 refills | Status: AC

## 2016-10-08 MED ORDER — PREDNISONE 20 MG PO TABS
60.0000 mg | ORAL_TABLET | Freq: Once | ORAL | Status: AC
  Administered 2016-10-08: 60 mg via ORAL
  Filled 2016-10-08: qty 3

## 2016-10-08 MED ORDER — BENZONATATE 100 MG PO CAPS: 100.0000 mg | ORAL_CAPSULE | Freq: Four times a day (QID) | ORAL | 0 refills | Status: DC | PRN

## 2016-10-08 MED ORDER — POTASSIUM CHLORIDE CRYS ER 20 MEQ PO TBCR
40.0000 meq | EXTENDED_RELEASE_TABLET | Freq: Once | ORAL | Status: AC
  Administered 2016-10-08: 40 meq via ORAL
  Filled 2016-10-08: qty 2

## 2016-10-08 MED ORDER — ALBUTEROL SULFATE HFA 108 (90 BASE) MCG/ACT IN AERS: 2.0000 | INHALATION_SPRAY | Freq: Four times a day (QID) | RESPIRATORY_TRACT | 0 refills | Status: AC | PRN

## 2016-10-08 MED ORDER — BENZONATATE 100 MG PO CAPS
100.0000 mg | ORAL_CAPSULE | Freq: Once | ORAL | Status: AC
  Administered 2016-10-08: 100 mg via ORAL
  Filled 2016-10-08: qty 1

## 2016-10-08 MED ORDER — SODIUM CHLORIDE 0.9 % IV BOLUS (SEPSIS)
1000.0000 mL | Freq: Once | INTRAVENOUS | Status: AC
  Administered 2016-10-08: 1000 mL via INTRAVENOUS

## 2016-10-08 NOTE — ED Provider Notes (Signed)
[facility] Emergency Department Provider Note   ____________________________________________   First MD Initiated Contact with Patient 10/08/16 0407     (approximate)  I have reviewed the triage vital signs and the nursing notes.   HISTORY  Chief Complaint Chest Pain; Cough; and Dizziness    HPI Brooke Hogan is a 41 y.o. female who comes into the hospital today with a cold and some chest pain. The patient's her having these symptoms on Thursday. She's had a nonproductive cough with some mid chest pain and back pain. 2 the patient states that she felt dizzy. She's been taking Mucinex and ibuprofen at home. She states that the ibuprofen has helped and her pain is down to a 5 out of 10 in intensity. The patient denies any shortness of breath or abdominal pain. She's had no nausea or vomiting and she's had no sick contacts. The patient was concerned about her symptoms she decided to come into the hospital today for further evaluation.   Past Medical History:  Diagnosis Date  . Anxiety   . Hypertension     There are no active problems to display for this patient.   History reviewed. No pertinent surgical history.  Prior to Admission medications   Medication Sig Start Date End Date Taking? Authorizing Provider  atenolol (TENORMIN) 25 MG tablet Take 50 mg by mouth daily. 04/02/16  Yes [provider]  cetirizine (ZYRTEC) 10 MG tablet Take 10 mg by mouth daily.   Yes [provider]  fexofenadine-pseudoephedrine (ALLEGRA-D) 60-120 MG 12 hr tablet Take 1 tablet by mouth 2 (two) times daily. 03/17/16  Yes Joni Reining, PA-C  fluticasone (FLONASE) 50 MCG/ACT nasal spray Place 1 spray into both nostrils daily.   Yes [provider]  lisinopril-hydrochlorothiazide (PRINZIDE,ZESTORETIC) 10-12.5 MG tablet Take 1 tablet by mouth daily. 04/02/16  Yes [provider]  albuterol (PROVENTIL HFA;VENTOLIN HFA) 108 (90 Base) MCG/ACT  inhaler Inhale 2 puffs into the lungs every 6 (six) hours as needed. 10/08/16   Rebecka Apley, MD  azithromycin (ZITHROMAX Z-PAK) 250 MG tablet Take 2 tablets (500 mg) on  Day 1,  followed by 1 tablet (250 mg) once daily on Days 2 through 5. 10/08/16 10/13/16  Rebecka Apley, MD  benzonatate (TESSALON PERLES) 100 MG capsule Take 1 capsule (100 mg total) by mouth every 6 (six) hours as needed for cough. 10/08/16   Rebecka Apley, MD  predniSONE (DELTASONE) 20 MG tablet Take 3 tablets (60 mg total) by mouth daily. 10/08/16   Rebecka Apley, MD    Allergies Patient has no known allergies.  History reviewed. No pertinent family history.  Social History Social History  Substance Use Topics  . Smoking status: Current Every Day Smoker    Packs/day: 0.50    Types: Cigarettes  . Smokeless tobacco: Never Used  . Alcohol use No    Review of Systems  Constitutional: No fever/chills Eyes: No visual changes. ENT: No sore throat. Cardiovascular: chest pain. Respiratory: cough with shortness of breath. Gastrointestinal: No abdominal pain.  No nausea, no vomiting.  No diarrhea.  No constipation. Genitourinary: Negative for dysuria. Musculoskeletal: Negative for back pain. Skin: Negative for rash. Neurological: dizziness   ____________________________________________   PHYSICAL EXAM:  VITAL SIGNS: ED Triage Vitals  Enc Vitals Group     BP 10/07/16 2336 140/76     Pulse Rate 10/07/16 2336 79     Resp 10/07/16 2336 18     Temp  10/07/16 2336 97.8 F (36.6 C)     Temp Source 10/07/16 2336 Oral     SpO2 10/07/16 2336 100 %     Weight 10/07/16 2337 163 lb (73.9 kg)     Height 10/07/16 2337 5' (1.524 m)     Head Circumference --      Peak Flow --      Pain Score 10/07/16 2338 8     Pain Loc --      Pain Edu? --      Excl. in GC? --     Constitutional: Alert and oriented. Well appearing and in milddistress. Eyes: Conjunctivae are normal. PERRL. EOMI. Head:  Atraumatic. Nose: No congestion/rhinnorhea. Mouth/Throat: Mucous membranes are moist.  Oropharynx non-erythematous. Cardiovascular: Normal rate, regular rhythm. Grossly normal heart sounds.  Good peripheral circulation. Respiratory: Normal respiratory effort.  No retractions. Lungs CTAB. Gastrointestinal: Soft and nontender. No distention. active bowel sounds Musculoskeletal: No lower extremity tenderness nor edema.   Neurologic:  Normal speech and language.  Skin:  Skin is warm, dry and intact.  Psychiatric: Mood and affect are normal. .  ____________________________________________   LABS (all labs ordered are listed, but only abnormal results are displayed)  Labs Reviewed  BASIC METABOLIC PANEL - Abnormal; Notable for the following:       Result Value   Sodium 127 (*)    Potassium 3.0 (*)    Chloride 91 (*)    Calcium 8.8 (*)    All other components within normal limits  CBC  TROPONIN I  TROPONIN I   ____________________________________________  EKG  ED ECG REPORT I, Rebecka Apley, the attending physician, personally viewed and interpreted this ECG.   Date: 10/07/2016  EKG Time: 2334  Rate: 78  Rhythm: normal sinus rhythm  Axis: normal  Intervals:none  ST&T Change: flipped to waves in lead III  ____________________________________________  RADIOLOGY  Dg Chest 2 View  Result Date: 10/08/2016 CLINICAL DATA:  Chest pain. Nonproductive cough and sinus congestion. EXAM: CHEST  2 VIEW COMPARISON:  Radiograph 04/17/2016 FINDINGS: The cardiomediastinal contours are normal. The lungs are clear. Pulmonary vasculature is normal. No consolidation, pleural effusion, or pneumothorax. No acute osseous abnormalities are seen. IMPRESSION: No acute abnormality. Electronically Signed   By: Rubye Oaks M.D.   On: 10/08/2016 00:53    ____________________________________________   PROCEDURES  Procedure(s) performed: None  Procedures  Critical Care performed:  No  ____________________________________________   INITIAL IMPRESSION / ASSESSMENT AND PLAN / ED COURSE  Pertinent labs & imaging results that were available during my care of the patient were reviewed by me and considered in my medical decision making (see chart for details).  this is a 41 year old female who comes into the hospital today with a cold with cough, chest pain and some shortness of breath according to triage. The patient does not have pneumonia but she did have some slight scattered wheezing on exam. We did check some blood work to include a troponin twice and it was negative. I feel that the patient has bronchitis. She is a smoker and her symptoms are likely from bronchitis. I did give the patient a GI cocktail as well as a liter of normal saline. She does have some hyponatremia and hypokalemia. The patient also received a dose of potassium Tessalon Perles and prednisone. She was resting comfortably 1 back in to evaluate her. The patient states her pain is improved. I will discharge the patient home with a Z-Pak. She has no other complaints  at this time. The patient be discharged home.      ____________________________________________   FINAL CLINICAL IMPRESSION(S) / ED DIAGNOSES  Final diagnoses:  Bronchitis  Costochondritis  Chest pain, unspecified type  Hyponatremia      NEW MEDICATIONS STARTED DURING THIS VISIT:  Discharge Medication List as of 10/08/2016  6:42 AM    START taking these medications   Details  albuterol (PROVENTIL HFA;VENTOLIN HFA) 108 (90 Base) MCG/ACT inhaler Inhale 2 puffs into the lungs every 6 (six) hours as needed., Starting Mon 10/08/2016, Print    azithromycin (ZITHROMAX Z-PAK) 250 MG tablet Take 2 tablets (500 mg) on  Day 1,  followed by 1 tablet (250 mg) once daily on Days 2 through 5., Print    benzonatate (TESSALON PERLES) 100 MG capsule Take 1 capsule (100 mg total) by mouth every 6 (six) hours as needed for cough., Starting Mon  10/08/2016, Print    predniSONE (DELTASONE) 20 MG tablet Take 3 tablets (60 mg total) by mouth daily., Starting Mon 10/08/2016, Print         Note:  This document was prepared using Dragon voice recognition software and may include unintentional dictation errors.    Rebecka ApleyWebster, Jentzen Minasyan P, MD 10/08/16 [phone]

## 2016-10-08 NOTE — ED Notes (Signed)
[facility]

## 2016-10-08 NOTE — Discharge Instructions (Signed)
Please follow up with your primary care physician.

## 2016-10-08 NOTE — ED Notes (Signed)
Reviewed d/c instructions, follow-up care, prescriptions with patient. Pt verbalized understanding.  

## 2016-11-29 ENCOUNTER — Other Ambulatory Visit: Payer: Self-pay | Admitting: Family Medicine

## 2016-11-29 DIAGNOSIS — Z1231 Encounter for screening mammogram for malignant neoplasm of breast: Secondary | ICD-10-CM

## 2017-01-13 ENCOUNTER — Emergency Department
Admission: EM | Admit: 2017-01-13 | Discharge: 2017-01-13 | Disposition: A | Discharge status: Home / Self Care | Payer: Medicaid Other | Attending: Emergency Medicine | Admitting: Emergency Medicine

## 2017-01-13 ENCOUNTER — Emergency Department: Payer: Medicaid Other

## 2017-01-13 ENCOUNTER — Other Ambulatory Visit: Payer: Self-pay

## 2017-01-13 DIAGNOSIS — R202 Paresthesia of skin: Secondary | ICD-10-CM | POA: Insufficient documentation

## 2017-01-13 DIAGNOSIS — I10 Essential (primary) hypertension: Secondary | ICD-10-CM | POA: Insufficient documentation

## 2017-01-13 DIAGNOSIS — F17210 Nicotine dependence, cigarettes, uncomplicated: Secondary | ICD-10-CM | POA: Diagnosis not present

## 2017-01-13 DIAGNOSIS — M5412 Radiculopathy, cervical region: Secondary | ICD-10-CM | POA: Diagnosis not present

## 2017-01-13 DIAGNOSIS — Z79899 Other long term (current) drug therapy: Secondary | ICD-10-CM | POA: Diagnosis not present

## 2017-01-13 DIAGNOSIS — M546 Pain in thoracic spine: Secondary | ICD-10-CM | POA: Diagnosis present

## 2017-01-13 LAB — CBC
HEMATOCRIT: 35.8 % (ref 35.0–47.0)
HEMOGLOBIN: 12.8 g/dL (ref 12.0–16.0)
MCH: 31 pg (ref 26.0–34.0)
MCHC: 35.8 g/dL (ref 32.0–36.0)
MCV: 86.4 fL (ref 80.0–100.0)
Platelets: 329 10*3/uL (ref 150–440)
RBC: 4.14 MIL/uL (ref 3.80–5.20)
RDW: 13.1 % (ref 11.5–14.5)
WBC: 7.2 10*3/uL (ref 3.6–11.0)

## 2017-01-13 LAB — BASIC METABOLIC PANEL
Anion gap: 11 (ref 5–15)
BUN: 18 mg/dL (ref 6–20)
CALCIUM: 9.2 mg/dL (ref 8.9–10.3)
CO2: 22 mmol/L (ref 22–32)
CREATININE: 0.82 mg/dL (ref 0.44–1.00)
Chloride: 100 mmol/L — ABNORMAL LOW (ref 101–111)
Glucose, Bld: 104 mg/dL — ABNORMAL HIGH (ref 65–99)
Potassium: 3.5 mmol/L (ref 3.5–5.1)
SODIUM: 133 mmol/L — AB (ref 135–145)

## 2017-01-13 LAB — TROPONIN I: Troponin I: <0.03 ng/mL (ref <0.03)

## 2017-01-13 MED ORDER — CYCLOBENZAPRINE HCL 10 MG PO TABS: 10.0000 mg | ORAL_TABLET | Freq: Three times a day (TID) | ORAL | 0 refills | Status: DC | PRN

## 2017-01-13 MED ORDER — IBUPROFEN 600 MG PO TABS
600.0000 mg | ORAL_TABLET | Freq: Once | ORAL | Status: AC
Start: 2017-01-13 — End: 2017-01-13
  Administered 2017-01-13: 600 mg via ORAL
  Filled 2017-01-13: qty 1

## 2017-01-13 MED ORDER — IBUPROFEN 600 MG PO TABS: 600.0000 mg | ORAL_TABLET | Freq: Three times a day (TID) | ORAL | 0 refills | Status: DC | PRN

## 2017-01-13 MED ORDER — CYCLOBENZAPRINE HCL 10 MG PO TABS
10.0000 mg | ORAL_TABLET | Freq: Once | ORAL | Status: AC
  Administered 2017-01-13: 10 mg via ORAL
  Filled 2017-01-13: qty 1

## 2017-01-13 NOTE — Discharge Instructions (Signed)
It was a pleasure to take care of you today, and thank you for coming to our emergency department.  If you have any questions or concerns before leaving please ask the nurse to grab me and I'm more than happy to go through your aftercare instructions again.  If you were prescribed any opioid pain medication today such as Norco, Vicodin, Percocet, morphine, hydrocodone, or oxycodone please make sure you do not drive when you are taking this medication as it can alter your ability to drive safely.  If you have any concerns once you are home that you are not improving or are in fact getting worse before you can make it to your follow-up appointment, please do not hesitate to call [phone] and come back for further evaluation.  Merrily BrittleNeil Rupal Childress, MD  Results for orders placed or performed during the hospital encounter of 01/13/17  Basic metabolic panel  Result Value Ref Range   Sodium 133 (L) 135 - 145 mmol/L   Potassium 3.5 3.5 - 5.1 mmol/L   Chloride 100 (L) 101 - 111 mmol/L   CO2 22 22 - 32 mmol/L   Glucose, Bld 104 (H) 65 - 99 mg/dL   BUN 18 6 - 20 mg/dL   Creatinine, Ser 4.090.82 0.44 - 1.00 mg/dL   Calcium 9.2 8.9 - 81.110.3 mg/dL   GFR calc non Af Amer >60 >60 mL/min   GFR calc Af Amer >60 >60 mL/min   Anion gap 11 5 - 15  CBC  Result Value Ref Range   WBC 7.2 3.6 - 11.0 K/uL   RBC 4.14 3.80 - 5.20 MIL/uL   Hemoglobin 12.8 12.0 - 16.0 g/dL   HCT 91.435.8 78.235.0 - 95.647.0 %   MCV 86.4 80.0 - 100.0 fL   MCH 31.0 26.0 - 34.0 pg   MCHC 35.8 32.0 - 36.0 g/dL   RDW 21.313.1 08.611.5 - 57.814.5 %   Platelets 329 150 - 440 K/uL  Troponin I  Result Value Ref Range   Troponin I <0.03 <0.03 ng/mL   Dg Chest 2 View  Result Date: 01/13/2017 CLINICAL DATA:  Back pain and left arm numbness. EXAM: CHEST  2 VIEW COMPARISON:  10/08/2016 FINDINGS: The heart size and mediastinal contours are within normal limits. Both lungs are clear. The visualized skeletal structures are unremarkable. IMPRESSION: No active cardiopulmonary  disease. Electronically Signed   By: Tollie Ethavid  Kwon M.D.   On: 01/13/2017 00:38

## 2017-01-13 NOTE — ED Provider Notes (Signed)
[facility] Emergency Department Provider Note  ____________________________________________   First MD Initiated Contact with Patient 01/13/17 [phone]     (approximate)  I have reviewed the triage vital signs and the nursing notes.   HISTORY  Chief Complaint Back Pain and Numbness    HPI Brooke Hogan is a 41 y.o. female who comes to the emergency department by EMS with moderate severity aching in her left upper back and left shoulder that began when she awoke from sleep this morning.  She was lying on her left side in bed and when she woke up she felt pain numbness and tingling in the left arm.  Her pain is not ripping or tearing and does not go straight to her back.  There is no chest pain.  Her symptoms are currently resolved although they worsen somewhat with ranging her left shoulder.  Past Medical History:  Diagnosis Date  . Anxiety   . Hypertension     There are no active problems to display for this patient.   No past surgical history on file.  Prior to Admission medications   Medication Sig Start Date End Date Taking? Authorizing Provider  albuterol (PROVENTIL HFA;VENTOLIN HFA) 108 (90 Base) MCG/ACT inhaler Inhale 2 puffs into the lungs every 6 (six) hours as needed. 10/08/16   Rebecka ApleyWebster, Allison P, MD  atenolol (TENORMIN) 25 MG tablet Take 50 mg by mouth daily. 04/02/16   [provider]  benzonatate (TESSALON PERLES) 100 MG capsule Take 1 capsule (100 mg total) by mouth every 6 (six) hours as needed for cough. 10/08/16   Rebecka ApleyWebster, Allison P, MD  cetirizine (ZYRTEC) 10 MG tablet Take 10 mg by mouth daily.    [provider]  cyclobenzaprine (FLEXERIL) 10 MG tablet Take 1 tablet (10 mg total) by mouth 3 (three) times daily as needed for muscle spasms. 01/13/17   Merrily Brittleifenbark, Shanyiah Conde, MD  fexofenadine-pseudoephedrine (ALLEGRA-D) 60-120 MG 12 hr tablet Take 1 tablet by mouth 2 (two) times daily. 03/17/16   Joni ReiningSmith, Ronald K, PA-C    fluticasone (FLONASE) 50 MCG/ACT nasal spray Place 1 spray into both nostrils daily.    [provider]  ibuprofen (ADVIL,MOTRIN) 600 MG tablet Take 1 tablet (600 mg total) by mouth every 8 (eight) hours as needed. 01/13/17   Merrily Brittleifenbark, Tasha Diaz, MD  lisinopril-hydrochlorothiazide (PRINZIDE,ZESTORETIC) 10-12.5 MG tablet Take 1 tablet by mouth daily. 04/02/16   [provider]  predniSONE (DELTASONE) 20 MG tablet Take 3 tablets (60 mg total) by mouth daily. 10/08/16   Rebecka ApleyWebster, Allison P, MD    Allergies Patient has no known allergies.  No family history on file.  Social History Social History   Tobacco Use  . Smoking status: Current Every Day Smoker    Packs/day: 0.50    Types: Cigarettes  . Smokeless tobacco: Never Used  Substance Use Topics  . Alcohol use: No  . Drug use: No    Review of Systems Constitutional: No fever/chills Eyes: No visual changes. ENT: No sore throat. Cardiovascular: Denies chest pain. Respiratory: Denies shortness of breath. Gastrointestinal: No abdominal pain.  No nausea, no vomiting.  No diarrhea.  No constipation. Genitourinary: Negative for dysuria. Musculoskeletal: Positive for back pain. Skin: Negative for rash. Neurological: Negative for headaches, focal weakness or numbness.   ____________________________________________   PHYSICAL EXAM:  VITAL SIGNS: ED Triage Vitals  Enc Vitals Group     BP --      Pulse --      Resp --  Temp --      Temp src --      SpO2 --      Weight 01/13/17 0013 160 lb (72.6 kg)     Height 01/13/17 0013 5\' 1"  (1.549 m)     Head Circumference --      Peak Flow --      Pain Score 01/13/17 0012 8     Pain Loc --      Pain Edu? --      Excl. in GC? --     Constitutional: Alert and oriented x4 pleasant cooperative speaks in full clear sentences no diaphoresis Eyes: PERRL EOMI. Head: Atraumatic. Nose: No congestion/rhinnorhea. Mouth/Throat: No trismus Neck: No stridor.    Cardiovascular: Normal rate, regular rhythm. Grossly normal heart sounds.  Good peripheral circulation. Respiratory: Normal respiratory effort.  No retractions. Lungs CTAB and moving good air Gastrointestinal: Soft nontender Musculoskeletal: Easily tender left upper back paraspinal full range of motion left shoulder Neurologic:  Normal speech and language. No gross focal neurologic deficits are appreciated. Skin:  Skin is warm, dry and intact. No rash noted. Psychiatric: Mood and affect are normal. Speech and behavior are normal.    ____________________________________________   DIFFERENTIAL includes but not limited to  Muscle spasm, aortic dissection, pulmonary embolism, acute coronary syndrome ____________________________________________   LABS (all labs ordered are listed, but only abnormal results are displayed)  Labs Reviewed  BASIC METABOLIC PANEL - Abnormal; Notable for the following components:      Result Value   Sodium 133 (*)    Chloride 100 (*)    Glucose, Bld 104 (*)    All other components within normal limits  CBC  TROPONIN I    Blood work reviewed by me with no acute disease __________________________________________  EKG  ED ECG REPORT I, Merrily BrittleNeil Azarius Lambson, the attending physician, personally viewed and interpreted this ECG.  Date: 01/13/2017 EKG Time:  Rate: 78 Rhythm: normal sinus rhythm QRS Axis: normal Intervals: normal ST/T Wave abnormalities: normal Narrative Interpretation: no evidence of acute ischemia  ____________________________________________  RADIOLOGY  Chest x-ray reviewed by me with no acute disease ____________________________________________   PROCEDURES  Procedure(s) performed: no  Procedures  Critical Care performed: no  Observation: no ____________________________________________   INITIAL IMPRESSION / ASSESSMENT AND PLAN / ED COURSE  Pertinent labs & imaging results that were available during my care of the  patient were reviewed by me and considered in my medical decision making (see chart for details).  Patient arrives well-appearing with focal tenderness over her back.  Her symptoms are most consistent with muscle spasm after lying on her side.  She feels improved after Flexeril.  She is discharged home in improved condition verbalized understanding and agreed with plan.      ____________________________________________   FINAL CLINICAL IMPRESSION(S) / ED DIAGNOSES  Final diagnoses:  Cervical radiculopathy at C6      NEW MEDICATIONS STARTED DURING THIS VISIT:  This SmartLink is deprecated. Use AVSMEDLIST instead to display the medication list for a patient.   Note:  This document was prepared using Dragon voice recognition software and may include unintentional dictation errors.     Merrily Brittleifenbark, Jerome Otter, MD 01/13/17 [phone]

## 2017-01-13 NOTE — ED Triage Notes (Signed)
Patient reports woke with back pain and realized that her left arm felt numb (reports some better but remains numb).  Patient reports felt fine when went to bed.

## 2017-01-13 NOTE — ED Notes (Signed)
EMS pt from home with c/o left shoulder and pain between her shoulder blades that started 30 minutes before she called EMS; says she felt like her heart was going to beat out of her chest; pt arrived alert and oriented x 3; ambulatory with steady gait

## 2017-01-13 NOTE — ED Notes (Addendum)
Pt stating that she woke up with left arm numbness and back was hurting. Pt stating pain in her back between her shoulder blades that is a pressure. Pt denying n/v and SOB. Pt stating that her left arm is not as numb as it was earlier. Pt has strong bilateral grips and has some decreased sensation in her left hand but not her arm. Pt denies any known injuries or new activities. Pt stating that her heart rate "was way up there," but is not sure what the rate was. Pt has hx of anxiety attacks per pt.

## 2017-02-01 ENCOUNTER — Emergency Department
Admission: EM | Admit: 2017-02-01 | Discharge: 2017-02-02 | Disposition: A | Discharge status: Home / Self Care | Payer: Medicaid Other | Attending: Student in an Organized Health Care Education/Training Program | Admitting: Student in an Organized Health Care Education/Training Program

## 2017-02-01 DIAGNOSIS — Z79899 Other long term (current) drug therapy: Secondary | ICD-10-CM | POA: Diagnosis not present

## 2017-02-01 DIAGNOSIS — I10 Essential (primary) hypertension: Secondary | ICD-10-CM | POA: Diagnosis not present

## 2017-02-01 DIAGNOSIS — F419 Anxiety disorder, unspecified: Secondary | ICD-10-CM | POA: Insufficient documentation

## 2017-02-01 DIAGNOSIS — F17210 Nicotine dependence, cigarettes, uncomplicated: Secondary | ICD-10-CM | POA: Insufficient documentation

## 2017-02-01 LAB — CBC
HCT: 34.8 % — ABNORMAL LOW (ref 35.0–47.0)
HEMOGLOBIN: 12.2 g/dL (ref 12.0–16.0)
MCH: 31.2 pg (ref 26.0–34.0)
MCHC: 35 g/dL (ref 32.0–36.0)
MCV: 89.1 fL (ref 80.0–100.0)
Platelets: 347 10*3/uL (ref 150–440)
RBC: 3.91 MIL/uL (ref 3.80–5.20)
RDW: 13.4 % (ref 11.5–14.5)
WBC: 4.9 10*3/uL (ref 3.6–11.0)

## 2017-02-01 LAB — COMPREHENSIVE METABOLIC PANEL
ALBUMIN: 4.4 g/dL (ref 3.5–5.0)
ALK PHOS: 66 U/L (ref 38–126)
ALT: 13 U/L — ABNORMAL LOW (ref 14–54)
AST: 28 U/L (ref 15–41)
Anion gap: 10 (ref 5–15)
BILIRUBIN TOTAL: 0.4 mg/dL (ref 0.3–1.2)
BUN: 11 mg/dL (ref 6–20)
CALCIUM: 9.2 mg/dL (ref 8.9–10.3)
CO2: 22 mmol/L (ref 22–32)
CREATININE: 0.84 mg/dL (ref 0.44–1.00)
Chloride: 104 mmol/L (ref 101–111)
GFR calc non Af Amer: >60 mL/min (ref >60)
GLUCOSE: 85 mg/dL (ref 65–99)
Potassium: 3.9 mmol/L (ref 3.5–5.1)
SODIUM: 136 mmol/L (ref 135–145)
TOTAL PROTEIN: 7.3 g/dL (ref 6.5–8.1)

## 2017-02-01 LAB — URINE DRUG SCREEN, QUALITATIVE (ARMC ONLY)
Amphetamines, Ur Screen: NOT DETECTED
BARBITURATES, UR SCREEN: NOT DETECTED
Benzodiazepine, Ur Scrn: NOT DETECTED
CANNABINOID 50 NG, UR ~~LOC~~: NOT DETECTED
COCAINE METABOLITE, UR ~~LOC~~: NOT DETECTED
MDMA (Ecstasy)Ur Screen: NOT DETECTED
Methadone Scn, Ur: NOT DETECTED
Opiate, Ur Screen: NOT DETECTED
PHENCYCLIDINE (PCP) UR S: NOT DETECTED
Tricyclic, Ur Screen: NOT DETECTED

## 2017-02-01 LAB — ACETAMINOPHEN LEVEL: Acetaminophen (Tylenol), Serum: <10 ug/mL — ABNORMAL LOW (ref 10–30)

## 2017-02-01 LAB — POCT PREGNANCY, URINE: Preg Test, Ur: NEGATIVE

## 2017-02-01 LAB — SALICYLATE LEVEL: Salicylate Lvl: <7 mg/dL (ref 2.8–30.0)

## 2017-02-01 LAB — ETHANOL: Alcohol, Ethyl (B): <10 mg/dL (ref <10)

## 2017-02-01 MED ORDER — FLUOXETINE HCL 20 MG PO CAPS: 20.0000 mg | ORAL_CAPSULE | Freq: Every day | ORAL | 0 refills | Status: DC

## 2017-02-01 MED ORDER — OLANZAPINE 2.5 MG PO TABS: 2.5000 mg | ORAL_TABLET | Freq: Every day | ORAL | 0 refills | Status: AC

## 2017-02-01 NOTE — ED Notes (Signed)
Report to Noel, RN 

## 2017-02-01 NOTE — ED Notes (Signed)
Pt in room 25 with SOC att

## 2017-02-01 NOTE — ED Notes (Signed)
Report received from Allison, RN

## 2017-02-01 NOTE — BH Assessment (Signed)
Assessment Note  Brooke Hogan is an 42 y.o. female who was brought into the ED by [facility] Dept voluntarily. Pt reports having severe panic attacks and high levels of paranoia to the point that her episodes are "driving my family crazy". The pt stated that she lives in the home with her Grandmother and Brooke Hogan who were both home when she has her most recent attack. She states that her uncle is the one who called the [facility] and encouraged her to come to the ED to seek help. She reports having a long history of depression and anxiety but that recently her paranoia has gotten worse. Pt stated: "My thoughts and how I see a situation happening doesn't really match up with what's actually going on. My mind tells me it's one way but my family says it's something different. Then when I make an appointment to go to the doctor my anxiety and paranoia get so bad that I just never go, so I guess coming here is the only ways I'm going to get help".   Pt reports her mother having a hx of depression and anxiety. Pt states that she was receiving outpatient therapy at [facility] and was taking Prozac but stopped the medication on her own because she didn't like the way it made her feel. She reports not having any emotional responses to situations, good or bad, while being on the medication which made her uncomfortable. "I need to be able to respond and feel stuff, you know?" Pt reports not using any drugs or alcohol within the past 3 years and has also quit smoking cigarettes. Pt says that her paranoia is the main reason that she quit smoking.  Pt denies SI/HI at this time. Pt denies any A/V hallucinations or delusions.        Diagnosis: Anxiety   Past Medical History:  Past Medical History:  Diagnosis Date  . Anxiety   . Hypertension     History reviewed. No pertinent surgical history.  Family History: No family history on file.  Social History:  reports that she has been smoking cigarettes.  She has  been smoking about 0.50 packs per day. she has never used smokeless tobacco. She reports that she does not drink alcohol or use drugs.  Additional Social History:  Alcohol / Drug Use Pain Medications: See MAR Prescriptions: See MAR Over the Counter: See MAR History of alcohol / drug use?: Yes Substance #1 Name of Substance 1: Alcohol 1 - Last Use / Amount: 4 YEARS  Substance #2 Name of Substance 2: Marijuana 2 - Last Use / Amount: 3 YEARS  CIWA: CIWA-Ar BP: 113/80 Pulse Rate: 88 COWS:    Allergies: No Known Allergies  Home Medications:  (Not in a hospital admission)  OB/GYN Status:  Patient's last menstrual period was 01/12/2017 (approximate).  General Assessment Data Location of Assessment: [facility] ED TTS Assessment: In system Is this a Tele or Face-to-Face Assessment?: Face-to-Face Is this an Initial Assessment or a Re-assessment for this encounter?: Initial Assessment Marital status: Single Is patient pregnant?: No Pregnancy Status: No Living Arrangements: Other relatives Can pt return to current living arrangement?: Yes Admission Status: Voluntary Is patient capable of signing voluntary admission?: Yes Referral Source: Self/Family/Friend Insurance type: Medicaid  Medical Screening Exam [facility] Walk-in ONLY) Medical Exam completed: Yes  Crisis Care Plan Living Arrangements: Other relatives Legal Guardian: Other:(Self) Name of Psychiatrist: n/a Name of Therapist: n/a  Education Status Is patient currently in school?: No Highest grade of school  patient has completed: 12th(Some college) Name of school: [name]  Risk to self with the past 6 months Suicidal Ideation: No Has patient been a risk to self within the past 6 months prior to admission? : No Suicidal Intent: No Has patient had any suicidal intent within the past 6 months prior to admission? : No Is patient at risk for suicide?: No Suicidal Plan?: No Has patient had any suicidal plan within the past 6  months prior to admission? : No Access to Means: No Previous Attempts/Gestures: No How many times?: 0 Other Self Harm Risks: n/a Triggers for Past Attempts: None known Intentional Self Injurious Behavior: None Family Suicide History: No Persecutory voices/beliefs?: No Depression: Yes Depression Symptoms: Loss of interest in usual pleasures, Feeling worthless/self pity Substance abuse history and/or treatment for substance abuse?: No Suicide prevention information given to non-admitted patients: Not applicable  Risk to Others within the past 6 months Homicidal Ideation: No Does patient have any lifetime risk of violence toward others beyond the six months prior to admission? : No Thoughts of Harm to Others: No Current Homicidal Intent: No Current Homicidal Plan: No Access to Homicidal Means: No History of harm to others?: No Assessment of Violence: None Noted Does patient have access to weapons?: No Criminal Charges Pending?: No Does patient have a court date: Yes Court Date: 02/08/17(Traffic court) Is patient on probation?: No  Psychosis Hallucinations: None noted Delusions: None noted  Mental Status Report Appearance/Hygiene: In scrubs Eye Contact: Good Motor Activity: Unremarkable Speech: Soft, Logical/coherent Level of Consciousness: Alert Mood: Depressed, Sad, Helpless, Pleasant Affect: Appropriate to circumstance, Depressed, Sad Anxiety Level: Panic Attacks Panic attack frequency: Often Most recent panic attack: 02/01/2017 Thought Processes: Coherent, Relevant, Circumstantial Judgement: Unimpaired Orientation: Appropriate for developmental age, Place, Person, Time, Situation Obsessive Compulsive Thoughts/Behaviors: Moderate  Cognitive Functioning Concentration: Normal Memory: Recent Intact, Remote Intact IQ: Average Insight: Good Impulse Control: Good Appetite: Poor Sleep: Decreased Total Hours of Sleep: 4 Vegetative Symptoms: None     Prior Inpatient  Therapy Prior Inpatient Therapy: No  Prior Outpatient Therapy Prior Outpatient Therapy: Yes Prior Therapy Dates: (Pt doesn't remember dates) Prior Therapy Facilty/Provider(s): [facility] Reason for Treatment: Depression, Anxiety Does patient have an ACCT team?: No Does patient have Intensive In-House Services?  : No Does patient have [facility] services? : No Does patient have P4CC services?: No  ADL Screening (condition at time of admission) Is the patient deaf or have difficulty hearing?: No Does the patient have difficulty seeing, even when wearing glasses/contacts?: No Does the patient have difficulty concentrating, remembering, or making decisions?: No Does the patient have difficulty dressing or bathing?: No Does the patient have difficulty walking or climbing stairs?: No Weakness of Legs: None Weakness of Arms/Hands: None  Home Assistive Devices/Equipment Home Assistive Devices/Equipment: None  Therapy Consults (therapy consults require a physician order) PT Evaluation Needed: No OT Evalulation Needed: No SLP Evaluation Needed: No   Values / Beliefs Cultural Requests During Hospitalization: None Spiritual Requests During Hospitalization: None Consults Spiritual Care Consult Needed: No Social Work Consult Needed: No Merchant navy officer (For Healthcare) Does Patient Have a Medical Advance Directive?: No Would patient like information on creating a medical advance directive?: No - Patient declined    Additional Information 1:1 In Past 12 Months?: No CIRT Risk: No Elopement Risk: No Does patient have medical clearance?: No  Child/Adolescent Assessment Running Away Risk: Denies Bed-Wetting: Denies Destruction of Property: Denies Cruelty to Animals: Denies Stealing: Denies Rebellious/Defies Authority: Denies Satanic Involvement: Denies Archivist:  Denies Problems at School: Denies Gang Involvement: Denies  Disposition:  Disposition Initial Assessment Completed for  this Encounter: Yes Disposition of Patient: Pending Review with psychiatrist  On Site Evaluation by:   Reviewed with Physician:    Hobson Lax D Laurelyn Terrero 02/01/2017 11:57 PM

## 2017-02-01 NOTE — ED Triage Notes (Signed)
Pt brought in voluntarily via [place] [facility].  Pt has been having anxiety and has poor support system at home.  Pt is not currently suicidal and not currently homicidal.  Pt wanting to speak with doctor and/or tts to get some outpatient resources and some help with anxiety and depression.  Pt is A&Ox4, in NAD, tearful in triage.

## 2017-02-01 NOTE — ED Provider Notes (Signed)
[facility] Emergency Department Provider Note    None    (approximate)  I have reviewed the triage vital signs and the nursing notes.   HISTORY  Chief Complaint Anxiety    HPI Brooke Hogan is a 42 y.o. female a history of anxiety presents with worsening symptoms in the past several days as well as passive suicidal ideations.  Denies any SI or HI at this time.  Not currently on any medications.  States that she is having trouble sleeping and having trouble completing daily tasks.  Was brought in by [place] [facility] for voluntary evaluation by psychiatry.  She denies any other symptoms.  Past Medical History:  Diagnosis Date  . Anxiety   . Hypertension    No family history on file. History reviewed. No pertinent surgical history. There are no active problems to display for this patient.     Prior to Admission medications   Medication Sig Start Date End Date Taking? Authorizing Provider  albuterol (PROVENTIL HFA;VENTOLIN HFA) 108 (90 Base) MCG/ACT inhaler Inhale 2 puffs into the lungs every 6 (six) hours as needed. 10/08/16   Rebecka ApleyWebster, Allison P, MD  atenolol (TENORMIN) 25 MG tablet Take 50 mg by mouth daily. 04/02/16   [provider]  benzonatate (TESSALON PERLES) 100 MG capsule Take 1 capsule (100 mg total) by mouth every 6 (six) hours as needed for cough. 10/08/16   Rebecka ApleyWebster, Allison P, MD  cetirizine (ZYRTEC) 10 MG tablet Take 10 mg by mouth daily.    [provider]  cyclobenzaprine (FLEXERIL) 10 MG tablet Take 1 tablet (10 mg total) by mouth 3 (three) times daily as needed for muscle spasms. 01/13/17   Merrily Brittleifenbark, Neil, MD  fexofenadine-pseudoephedrine (ALLEGRA-D) 60-120 MG 12 hr tablet Take 1 tablet by mouth 2 (two) times daily. 03/17/16   Joni ReiningSmith, Ronald K, PA-C  fluticasone (FLONASE) 50 MCG/ACT nasal spray Place 1 spray into both nostrils daily.    [provider]  ibuprofen (ADVIL,MOTRIN) 600 MG tablet Take  1 tablet (600 mg total) by mouth every 8 (eight) hours as needed. 01/13/17   Merrily Brittleifenbark, Neil, MD  lisinopril-hydrochlorothiazide (PRINZIDE,ZESTORETIC) 10-12.5 MG tablet Take 1 tablet by mouth daily. 04/02/16   [provider]  predniSONE (DELTASONE) 20 MG tablet Take 3 tablets (60 mg total) by mouth daily. 10/08/16   Rebecka ApleyWebster, Allison P, MD    Allergies Patient has no known allergies.    Social History Social History   Tobacco Use  . Smoking status: Current Every Day Smoker    Packs/day: 0.50    Types: Cigarettes  . Smokeless tobacco: Never Used  Substance Use Topics  . Alcohol use: No  . Drug use: No    Review of Systems Patient denies headaches, rhinorrhea, blurry vision, numbness, shortness of breath, chest pain, edema, cough, abdominal pain, nausea, vomiting, diarrhea, dysuria, fevers, rashes or hallucinations unless otherwise stated above in HPI. ____________________________________________   PHYSICAL EXAM:  VITAL SIGNS: Vitals:   02/01/17 2023  BP: 113/80  Pulse: 88  Resp: 18  Temp: 98.3 F (36.8 C)  SpO2: 99%    Constitutional: Alert and oriented. Well appearing and in no acute distress. Eyes: Conjunctivae are normal.  Head: Atraumatic. Nose: No congestion/rhinnorhea. Mouth/Throat: Mucous membranes are moist.   Neck: No stridor. Painless ROM.  Cardiovascular: Normal rate, regular rhythm. Grossly normal heart sounds.  Good peripheral circulation. Respiratory: Normal respiratory effort.  No retractions. Lungs CTAB. Gastrointestinal: Soft and nontender. No distention. No abdominal bruits.  No CVA tenderness. Genitourinary:  Musculoskeletal: No lower extremity tenderness nor edema.  No joint effusions. Neurologic:  Normal speech and language. No gross focal neurologic deficits are appreciated. No facial droop Skin:  Skin is warm, dry and intact. No rash noted. Psychiatric: Mood and affect are normal. Speech and behavior are  normal.  ____________________________________________   LABS (all labs ordered are listed, but only abnormal results are displayed)  No results found for this or any previous visit (from the past 24 hour(s)). ________________________________________________________________________________________   PROCEDURES  Procedure(s) performed:  Procedures    Critical Care performed: no ____________________________________________   INITIAL IMPRESSION / ASSESSMENT AND PLAN / ED COURSE  Pertinent labs & imaging results that were available during my care of the patient were reviewed by me and considered in my medical decision making (see chart for details).  DDX: Psychosis, delirium, medication effect, noncompliance, polysubstance abuse, Si, Hi, depression   Brooke Hogan is a 42 y.o. who presents to the ED with anxiety disorder as described above.  She is well-appearing and in no acute distress.  She has no SI or HI.  Patient evaluated by psychiatry of made recommendations for fluoxetine as well as Zyprexa and follow-up with [facility].  Do believe patient is stable and appropriate for outpatient follow-up.      ____________________________________________   FINAL CLINICAL IMPRESSION(S) / ED DIAGNOSES  Final diagnoses:  Anxiety      NEW MEDICATIONS STARTED DURING THIS VISIT:  This SmartLink is deprecated. Use AVSMEDLIST instead to display the medication list for a patient.   Note:  This document was prepared using Dragon voice recognition software and may include unintentional dictation errors.    Willy Eddy, MD 02/01/17 [phone]

## 2017-02-02 NOTE — ED Notes (Signed)
Pt seen leaving with paperwork approximately att; this RN triaging behavioral pt that went to #25, unknown who performed DC

## 2017-04-02 ENCOUNTER — Encounter: Payer: Self-pay | Admitting: Emergency Medicine

## 2017-04-02 ENCOUNTER — Other Ambulatory Visit: Payer: Self-pay

## 2017-04-02 DIAGNOSIS — F329 Major depressive disorder, single episode, unspecified: Secondary | ICD-10-CM | POA: Insufficient documentation

## 2017-04-02 DIAGNOSIS — F410 Panic disorder [episodic paroxysmal anxiety] without agoraphobia: Secondary | ICD-10-CM | POA: Insufficient documentation

## 2017-04-02 DIAGNOSIS — R45851 Suicidal ideations: Secondary | ICD-10-CM | POA: Insufficient documentation

## 2017-04-02 DIAGNOSIS — R079 Chest pain, unspecified: Secondary | ICD-10-CM | POA: Insufficient documentation

## 2017-04-02 DIAGNOSIS — Z87891 Personal history of nicotine dependence: Secondary | ICD-10-CM | POA: Insufficient documentation

## 2017-04-02 DIAGNOSIS — F29 Unspecified psychosis not due to a substance or known physiological condition: Secondary | ICD-10-CM | POA: Diagnosis not present

## 2017-04-02 DIAGNOSIS — Z79899 Other long term (current) drug therapy: Secondary | ICD-10-CM | POA: Diagnosis not present

## 2017-04-02 DIAGNOSIS — I10 Essential (primary) hypertension: Secondary | ICD-10-CM | POA: Diagnosis not present

## 2017-04-02 NOTE — ED Triage Notes (Signed)
Pt presents to ED via EMS with c/o of a "really bad panic attack". Pt states "they are getting really bad and my family is thinking about committing me they are so bad". Pt unsure of what triggered this event tonight. Reports tightness in her chest that goes through to her back and left arm numbness. Pt states these symptoms are typical for her during her panic attacks but they seem be taking longer to improve. Pt currently has no increased work of breathing or acute distress noted.

## 2017-04-03 ENCOUNTER — Emergency Department: Payer: Medicaid Other

## 2017-04-03 ENCOUNTER — Emergency Department
Admission: EM | Admit: 2017-04-03 | Discharge: 2017-04-03 | Disposition: A | Discharge status: Home / Self Care | Payer: Medicaid Other | Attending: Emergency Medicine | Admitting: Emergency Medicine

## 2017-04-03 ENCOUNTER — Emergency Department (EMERGENCY_DEPARTMENT_HOSPITAL)
Admission: EM | Admit: 2017-04-03 | Discharge: 2017-04-04 | Disposition: A | Discharge status: Other Acute Inpatient Hospital | Payer: Medicaid Other | Source: Home / Self Care | Attending: Emergency Medicine | Admitting: Emergency Medicine

## 2017-04-03 ENCOUNTER — Other Ambulatory Visit: Payer: Self-pay

## 2017-04-03 ENCOUNTER — Encounter: Payer: Self-pay | Admitting: Emergency Medicine

## 2017-04-03 DIAGNOSIS — F29 Unspecified psychosis not due to a substance or known physiological condition: Secondary | ICD-10-CM | POA: Diagnosis not present

## 2017-04-03 DIAGNOSIS — I10 Essential (primary) hypertension: Secondary | ICD-10-CM

## 2017-04-03 DIAGNOSIS — R079 Chest pain, unspecified: Secondary | ICD-10-CM

## 2017-04-03 DIAGNOSIS — F419 Anxiety disorder, unspecified: Secondary | ICD-10-CM

## 2017-04-03 DIAGNOSIS — R45851 Suicidal ideations: Secondary | ICD-10-CM

## 2017-04-03 HISTORY — DX: Major depressive disorder, single episode, unspecified: F32.9

## 2017-04-03 HISTORY — DX: Depression, unspecified: F32.A

## 2017-04-03 LAB — CBC
HCT: 38.8 % (ref 35.0–47.0)
HCT: 40 % (ref 35.0–47.0)
HEMOGLOBIN: 13.3 g/dL (ref 12.0–16.0)
Hemoglobin: 13.3 g/dL (ref 12.0–16.0)
MCH: 30.7 pg (ref 26.0–34.0)
MCH: 29.9 pg (ref 26.0–34.0)
MCHC: 34.2 g/dL (ref 32.0–36.0)
MCHC: 33.3 g/dL (ref 32.0–36.0)
MCV: 89.7 fL (ref 80.0–100.0)
MCV: 90 fL (ref 80.0–100.0)
PLATELETS: 365 10*3/uL (ref 150–440)
Platelets: 331 10*3/uL (ref 150–440)
RBC: 4.32 MIL/uL (ref 3.80–5.20)
RBC: 4.44 MIL/uL (ref 3.80–5.20)
RDW: 12.9 % (ref 11.5–14.5)
RDW: 13 % (ref 11.5–14.5)
WBC: 4.4 10*3/uL (ref 3.6–11.0)
WBC: 4.9 10*3/uL (ref 3.6–11.0)

## 2017-04-03 LAB — BASIC METABOLIC PANEL
Anion gap: 9 (ref 5–15)
BUN: 19 mg/dL (ref 6–20)
CHLORIDE: 106 mmol/L (ref 101–111)
CO2: 25 mmol/L (ref 22–32)
CREATININE: 0.73 mg/dL (ref 0.44–1.00)
Calcium: 9.6 mg/dL (ref 8.9–10.3)
GFR calc Af Amer: >60 mL/min (ref >60)
GFR calc non Af Amer: >60 mL/min (ref >60)
GLUCOSE: 84 mg/dL (ref 65–99)
POTASSIUM: 3.7 mmol/L (ref 3.5–5.1)
SODIUM: 140 mmol/L (ref 135–145)

## 2017-04-03 LAB — URINE DRUG SCREEN, QUALITATIVE (ARMC ONLY)
AMPHETAMINES, UR SCREEN: NOT DETECTED
BENZODIAZEPINE, UR SCRN: POSITIVE — AB
Barbiturates, Ur Screen: POSITIVE — AB
Cannabinoid 50 Ng, Ur ~~LOC~~: NOT DETECTED
Cocaine Metabolite,Ur ~~LOC~~: NOT DETECTED
MDMA (Ecstasy)Ur Screen: NOT DETECTED
Methadone Scn, Ur: NOT DETECTED
Opiate, Ur Screen: NOT DETECTED
PHENCYCLIDINE (PCP) UR S: NOT DETECTED
Tricyclic, Ur Screen: NOT DETECTED

## 2017-04-03 LAB — COMPREHENSIVE METABOLIC PANEL
ALT: 27 U/L (ref 14–54)
ANION GAP: 11 (ref 5–15)
AST: 23 U/L (ref 15–41)
Albumin: 4.5 g/dL (ref 3.5–5.0)
Alkaline Phosphatase: 57 U/L (ref 38–126)
BILIRUBIN TOTAL: 0.9 mg/dL (ref 0.3–1.2)
BUN: 18 mg/dL (ref 6–20)
CALCIUM: 9.3 mg/dL (ref 8.9–10.3)
CO2: 24 mmol/L (ref 22–32)
Chloride: 106 mmol/L (ref 101–111)
Creatinine, Ser: 0.72 mg/dL (ref 0.44–1.00)
GFR calc Af Amer: >60 mL/min (ref >60)
Glucose, Bld: 130 mg/dL — ABNORMAL HIGH (ref 65–99)
POTASSIUM: 3.6 mmol/L (ref 3.5–5.1)
Sodium: 141 mmol/L (ref 135–145)
TOTAL PROTEIN: 8.2 g/dL — AB (ref 6.5–8.1)

## 2017-04-03 LAB — POCT PREGNANCY, URINE: Preg Test, Ur: NEGATIVE

## 2017-04-03 LAB — SALICYLATE LEVEL: Salicylate Lvl: <7 mg/dL (ref 2.8–30.0)

## 2017-04-03 LAB — TROPONIN I

## 2017-04-03 LAB — ACETAMINOPHEN LEVEL

## 2017-04-03 LAB — ETHANOL

## 2017-04-03 MED ORDER — LORAZEPAM 1 MG PO TABS
1.0000 mg | ORAL_TABLET | Freq: Once | ORAL | Status: AC
  Administered 2017-04-03: 1 mg via ORAL
  Filled 2017-04-03: qty 1

## 2017-04-03 MED ORDER — LISINOPRIL 5 MG PO TABS
5.0000 mg | ORAL_TABLET | Freq: Every day | ORAL | Status: DC
  Administered 2017-04-03 – 2017-04-04 (×2): 5 mg via ORAL
  Filled 2017-04-03 (×2): qty 1

## 2017-04-03 MED ORDER — GI COCKTAIL ~~LOC~~
30.0000 mL | Freq: Once | ORAL | Status: AC
  Administered 2017-04-03: 30 mL via ORAL
  Filled 2017-04-03: qty 30

## 2017-04-03 MED ORDER — OLANZAPINE 5 MG PO TABS
5.0000 mg | ORAL_TABLET | Freq: Every day | ORAL | Status: DC
  Administered 2017-04-03: 5 mg via ORAL
  Filled 2017-04-03: qty 1

## 2017-04-03 NOTE — ED Provider Notes (Signed)
[facility] Emergency Department Provider Note  ____________________________________________  Time seen: Approximately 2:37 PM  I have reviewed the triage vital signs and the nursing notes.   HISTORY  Chief Complaint Suicidal    HPI Brooke Hogan is a 42 y.o. female who reports feeling sad and overwhelmed and racing thoughts. She also notes having suicidal ideation with a plan to cut her wrists and overdose on medications. Today she had locked herself inside of her house and was carrying a knife when a friend came and found her and called for help. The patient had written a suicide note to her son. She reports that she still feels suicidal and does not feel safe.  Symptoms are constant, gradually worsening. No aggravating or alleviating factors.     Past Medical History:  Diagnosis Date  . Anxiety   . Depression   . Hypertension      There are no active problems to display for this patient.    History reviewed. No pertinent surgical history.   Prior to Admission medications   Medication Sig Start Date End Date Taking? Authorizing Provider  albuterol (PROVENTIL HFA;VENTOLIN HFA) 108 (90 Base) MCG/ACT inhaler Inhale 2 puffs into the lungs every 6 (six) hours as needed. 10/08/16   Rebecka ApleyWebster, Allison P, MD  atenolol (TENORMIN) 25 MG tablet Take 50 mg by mouth daily. 04/02/16   [provider]  benzonatate (TESSALON PERLES) 100 MG capsule Take 1 capsule (100 mg total) by mouth every 6 (six) hours as needed for cough. 10/08/16   Rebecka ApleyWebster, Allison P, MD  cetirizine (ZYRTEC) 10 MG tablet Take 10 mg by mouth daily.    [provider]  cyclobenzaprine (FLEXERIL) 10 MG tablet Take 1 tablet (10 mg total) by mouth 3 (three) times daily as needed for muscle spasms. 01/13/17   Merrily Brittleifenbark, Neil, MD  fexofenadine-pseudoephedrine (ALLEGRA-D) 60-120 MG 12 hr tablet Take 1 tablet by mouth 2 (two) times daily. 03/17/16   Joni ReiningSmith, Ronald K, PA-C  FLUoxetine  (PROZAC) 20 MG capsule Take 1 capsule (20 mg total) by mouth at bedtime. 02/01/17 02/01/18  Willy Eddyobinson, Patrick, MD  fluticasone (FLONASE) 50 MCG/ACT nasal spray Place 1 spray into both nostrils daily.    [provider]  ibuprofen (ADVIL,MOTRIN) 600 MG tablet Take 1 tablet (600 mg total) by mouth every 8 (eight) hours as needed. 01/13/17   Merrily Brittleifenbark, Neil, MD  lisinopril-hydrochlorothiazide (PRINZIDE,ZESTORETIC) 10-12.5 MG tablet Take 1 tablet by mouth daily. 04/02/16   [provider]  OLANZapine (ZYPREXA) 2.5 MG tablet Take 1 tablet (2.5 mg total) by mouth at bedtime. 02/01/17 02/01/18  Willy Eddyobinson, Patrick, MD  predniSONE (DELTASONE) 20 MG tablet Take 3 tablets (60 mg total) by mouth daily. 10/08/16   Rebecka ApleyWebster, Allison P, MD     Allergies Patient has no known allergies.   No family history on file.  Social History Social History   Tobacco Use  . Smoking status: Former Smoker    Packs/day: 0.50    Types: Cigarettes  . Smokeless tobacco: Never Used  Substance Use Topics  . Alcohol use: No  . Drug use: No    Review of Systems  Constitutional:   No fever or chills.  ENT:   No sore throat. No rhinorrhea. Cardiovascular:   No chest pain or syncope. Respiratory:   No dyspnea or cough. Gastrointestinal:   Negative for abdominal pain, vomiting and diarrhea.  Musculoskeletal:   Chronic mid back pain, worse with movement and change of position All other systems  reviewed and are negative except as documented above in ROS and HPI.  ____________________________________________   PHYSICAL EXAM:  VITAL SIGNS: ED Triage Vitals  Enc Vitals Group     BP 04/03/17 1326 121/87     Pulse Rate 04/03/17 1326 75     Resp 04/03/17 1326 17     Temp 04/03/17 1326 98.4 F (36.9 C)     Temp Source 04/03/17 1326 Oral     SpO2 04/03/17 1326 100 %     Weight 04/03/17 1324 160 lb (72.6 kg)     Height 04/03/17 1324 5' (1.524 m)     Head Circumference --      Peak Flow --      Pain Score  04/03/17 1401 10     Pain Loc --      Pain Edu? --      Excl. in GC? --     Vital signs reviewed, nursing assessments reviewed.   Constitutional:   Alert and oriented. Well appearing and in no distress. Eyes:   No scleral icterus.  EOMI. No nystagmus. No conjunctival pallor. PERRL. ENT   Head:   Normocephalic and atraumatic.   Nose:   No congestion/rhinnorhea.    Mouth/Throat:   MMM, no pharyngeal erythema. No peritonsillar mass.    Neck:   No meningismus. Full ROM. Hematological/Lymphatic/Immunilogical:   No cervical lymphadenopathy. Cardiovascular:   RRR. Symmetric bilateral radial and DP pulses.  No murmurs.  Respiratory:   Normal respiratory effort without tachypnea/retractions. Breath sounds are clear and equal bilaterally. No wheezes/rales/rhonchi. Gastrointestinal:   Soft and nontender. Non distended. There is no CVA tenderness.  No rebound, rigidity, or guarding. Genitourinary:   deferred Musculoskeletal:   Normal range of motion in all extremities. No joint effusions.  No lower extremity tenderness.  No edema. No midline spinal tenderness Neurologic:   Normal speech and language.  Normal affect, incongruent the symptoms Motor grossly intact. No acute focal neurologic deficits are appreciated.  Skin:    Skin is warm, dry and intact. No rash noted.  No petechiae, purpura, or bullae.  ____________________________________________    LABS (pertinent positives/negatives) (all labs ordered are listed, but only abnormal results are displayed) Labs Reviewed  COMPREHENSIVE METABOLIC PANEL - Abnormal; Notable for the following components:      Result Value   Glucose, Bld 130 (*)    Total Protein 8.2 (*)    All other components within normal limits  ACETAMINOPHEN LEVEL - Abnormal; Notable for the following components:   Acetaminophen (Tylenol), Serum <10 (*)    All other components within normal limits  SALICYLATE LEVEL  CBC  ETHANOL  URINE DRUG SCREEN,  QUALITATIVE ([facility] ONLY)  POCT PREGNANCY, URINE  POC URINE PREG, ED   ____________________________________________   EKG    ____________________________________________    RADIOLOGY  Dg Chest 2 View  Result Date: 04/03/2017 CLINICAL DATA:  Panic attacks EXAM: CHEST - 2 VIEW COMPARISON:  01/13/2017 FINDINGS: The heart size and mediastinal contours are within normal limits. Both lungs are clear. The visualized skeletal structures are unremarkable. IMPRESSION: No active cardiopulmonary disease. Electronically Signed   By: Jasmine Pang M.D.   On: 04/03/2017 02:24    ____________________________________________   PROCEDURES Procedures  ____________________________________________    CLINICAL IMPRESSION / ASSESSMENT AND PLAN / ED COURSE  Pertinent labs & imaging results that were available during my care of the patient were reviewed by me and considered in my medical decision making (see chart for details).   Patient  well-appearing no acute distress, presents with normal vital signs. Chest x-ray and labs unremarkable without evidence of pneumonia or pneumothorax on chest x-ray, no evidence of toxic ingestion acidosis or severe dehydration on labs. Patient is medically stable and cleared to proceed with psychiatric evaluation and management.    ____________________________________________   FINAL CLINICAL IMPRESSION(S) / ED DIAGNOSES    Final diagnoses:  Suicidal ideation     ED Discharge Orders    None      Portions of this note were generated with dragon dictation software. Dictation errors may occur despite best attempts at proofreading.    Sharman Cheek, MD 04/03/17 1444

## 2017-04-03 NOTE — ED Notes (Signed)
Received patient from front.Made patient comfortable in bed.Patient alert and oriented x4.Denies SI.HI and AVH at this time.States "I am just sad."

## 2017-04-03 NOTE — ED Notes (Signed)
Pt states her social worker sent her here because she has si with a plan to slit her wrists.  Pt told to sit at first nurse desk until I can get her back to be triaged. Pt does not appear in distress at this time.

## 2017-04-03 NOTE — ED Notes (Signed)
Suicide sitter at bedside.

## 2017-04-03 NOTE — ED Notes (Addendum)
Consulting civil engineerCharge RN notified, OptometristBill. Scott, EDT to sit with patient. This RN will await until sitter arrives at bedside.     Per Lorin PicketScott, Dr. Scotty CourtStafford advised that suicide sitter may leave when Dr. Scotty CourtStafford saw patient before this RN took over assignment. I was not given in report or no discontinue SI sitter was placed before Dr. Scotty CourtStafford gave report to Dr. Alphonzo LemmingsMcShane.   When this RN reassessed patient for second time on rounds, patient reporting yes to being actively suicidal.

## 2017-04-03 NOTE — ED Notes (Signed)
Pt voices no active plans to kill herself at this time.

## 2017-04-03 NOTE — Discharge Instructions (Signed)
Please follow up with your primary care physician as well as [facility] for further evaluation of your anxiety and chest pain

## 2017-04-03 NOTE — ED Triage Notes (Signed)
Pt in via POV; reports SI x 2 weeks, thoughts worsening over the last week, with plan to "slit my wrists or take a hand full of pills."  Pt alert, calm, cooperative at this time.

## 2017-04-03 NOTE — ED Notes (Signed)
Psychiatrist at bedside

## 2017-04-03 NOTE — ED Notes (Signed)

## 2017-04-03 NOTE — ED Notes (Signed)
IVC/Consult pending/ TTS completed/ Plan to admit

## 2017-04-03 NOTE — ED Notes (Signed)
[facility]

## 2017-04-03 NOTE — ED Notes (Signed)
IVC/Consult pending/ TTS completed

## 2017-04-03 NOTE — BH Assessment (Signed)
Assessment Note  Brooke Hogan is an 42 y.o. female who presents to the ER, via law enforcement due to having thoughts of ending her life. Per the report of the patient, she has felt this way for several weeks. However, it increased when her grandmother was admitted to the hospital for medical reasons. She states she was living with her grandmother and believes she's the reason why the grandmother became ill. Patient further reports, she believes she have a bad "omen" on her life. Just this week, while at the "laundromat" eleven people was there and that's when she "really realized" she had the bad omen and she was going to die. "I'm going to die or my son. One of [place] is going to die.." On today (04/03/2017), the patient told her cousin she was having thoughts of ending her life and asked her for advice about getting help. "I thought she (cousin) was going to call our family but she called the police."  During the interview, the patient was calm, cooperative and pleasant. She denies current use of mind-altering substances. She denies involvement with the legal system and DSS. She also denies history of violence and aggression. Throughout the interview, the patient denied HI and AV/H.  Diagnosis: Depression  Past Medical History:  Past Medical History:  Diagnosis Date  . Anxiety   . Depression   . Hypertension     History reviewed. No pertinent surgical history.  Family History: No family history on file.  Social History:  reports that she has quit smoking. Her smoking use included cigarettes. She smoked 0.50 packs per day. she has never used smokeless tobacco. She reports that she does not drink alcohol or use drugs.  Additional Social History:  Alcohol / Drug Use Pain Medications: See PTA Prescriptions: See PTA Over the Counter: See PTA History of alcohol / drug use?: No history of alcohol / drug abuse Longest period of sobriety (when/how long): See PTA  CIWA: CIWA-Ar BP:  121/87 Pulse Rate: 75 COWS:    Allergies: No Known Allergies  Home Medications:  (Not in a hospital admission)  OB/GYN Status:  Patient's last menstrual period was 03/19/2017 (approximate).  General Assessment Data Location of Assessment: [facility] ED TTS Assessment: In system Is this a Tele or Face-to-Face Assessment?: Face-to-Face Is this an Initial Assessment or a Re-assessment for this encounter?: Initial Assessment Marital status: Divorced [employer] name: n/a Is patient pregnant?: No Pregnancy Status: No Living Arrangements: Other relatives Can pt return to current living arrangement?: Yes Admission Status: Voluntary Is patient capable of signing voluntary admission?: Yes Referral Source: Self/Family/Friend Insurance type: Medicaid  Medical Screening Exam [facility] Walk-in ONLY) Medical Exam completed: Yes  Crisis Care Plan Living Arrangements: Other relatives Legal Guardian: Other:(Self) Name of Psychiatrist: [facility] Name of Therapist: [facility]  Education Status Is patient currently in school?: No Is the patient employed, unemployed or receiving disability?: Unemployed  Risk to self with the past 6 months Suicidal Ideation: Yes-Currently Present Has patient been a risk to self within the past 6 months prior to admission? : Yes Suicidal Intent: No Has patient had any suicidal intent within the past 6 months prior to admission? : No Is patient at risk for suicide?: Yes Suicidal Plan?: Yes-Currently Present Has patient had any suicidal plan within the past 6 months prior to admission? : Yes Specify Current Suicidal Plan: Cut her wrist Access to Means: Yes Specify Access to Suicidal Means: [employer] What has been your use of drugs/alcohol within the  last 12 months?: Reports of no current use Previous Attempts/Gestures: Yes How many times?: 2 Other Self Harm Risks: Reports of none Triggers for Past Attempts: Spouse contact Intentional Self Injurious  Behavior: None Family Suicide History: Unknown Recent stressful life event(s): Conflict (Comment), Other (Comment) Persecutory voices/beliefs?: No Depression: Yes Depression Symptoms: Insomnia, Tearfulness, Isolating, Fatigue, Guilt, Feeling worthless/self pity Substance abuse history and/or treatment for substance abuse?: No Suicide prevention information given to non-admitted patients: Not applicable  Risk to Others within the past 6 months Homicidal Ideation: No Does patient have any lifetime risk of violence toward others beyond the six months prior to admission? : No Thoughts of Harm to Others: No Current Homicidal Intent: No Current Homicidal Plan: No Access to Homicidal Means: No Identified Victim: Reports of none History of harm to others?: No Assessment of Violence: None Noted Violent Behavior Description: Reports of none Does patient have access to weapons?: No Criminal Charges Pending?: No Does patient have a court date: No Is patient on probation?: No  Psychosis Hallucinations: None noted Delusions: None noted  Mental Status Report Appearance/Hygiene: Unremarkable Eye Contact: Good Motor Activity: Freedom of movement, Unremarkable Speech: Logical/coherent Level of Consciousness: Alert Mood: Depressed, Sad, Pleasant Affect: Sad, Appropriate to circumstance, Depressed Anxiety Level: Minimal Thought Processes: Coherent, Relevant Judgement: Unimpaired Orientation: Person, Place, Time, Situation, Appropriate for developmental age Obsessive Compulsive Thoughts/Behaviors: Minimal  Cognitive Functioning Concentration: Normal Memory: Recent Intact, Remote Intact Is patient IDD: No Is patient DD?: No Insight: Fair Impulse Control: Fair Appetite: Fair Have you had any weight changes? : No Change Sleep: Decreased Total Hours of Sleep: 4 Vegetative Symptoms: None  ADLScreening [facility] Assessment Services) Patient's cognitive ability adequate to safely complete  daily activities?: Yes Patient able to express need for assistance with ADLs?: Yes Independently performs ADLs?: Yes (appropriate for developmental age)  Prior Inpatient Therapy Prior Inpatient Therapy: No  Prior Outpatient Therapy Prior Outpatient Therapy: Yes Prior Therapy Dates: Current Prior Therapy Facilty/Provider(s): [facility] Reason for Treatment: Depression Does patient have an ACCT team?: No Does patient have [facility] services? : No Does patient have P4CC services?: No  ADL Screening (condition at time of admission) Patient's cognitive ability adequate to safely complete daily activities?: Yes Is the patient deaf or have difficulty hearing?: No Does the patient have difficulty seeing, even when wearing glasses/contacts?: No Does the patient have difficulty concentrating, remembering, or making decisions?: No Patient able to express need for assistance with ADLs?: Yes Does the patient have difficulty dressing or bathing?: No Independently performs ADLs?: Yes (appropriate for developmental age) Does the patient have difficulty walking or climbing stairs?: No Weakness of Legs: None Weakness of Arms/Hands: None  Home Assistive Devices/Equipment Home Assistive Devices/Equipment: None  Therapy Consults (therapy consults require a physician order) PT Evaluation Needed: No OT Evalulation Needed: No SLP Evaluation Needed: No Abuse/Neglect Assessment (Assessment to be complete while patient is alone) Abuse/Neglect Assessment Can Be Completed: Yes Physical Abuse: Denies Verbal Abuse: Denies Sexual Abuse: Denies Exploitation of patient/patient's resources: Denies Self-Neglect: Denies Values / Beliefs Cultural Requests During Hospitalization: None Spiritual Requests During Hospitalization: None Consults Spiritual Care Consult Needed: No Social Work Consult Needed: No Merchant navy officer (For Healthcare) Does Patient Have a Medical Advance Directive?: No Would  patient like information on creating a medical advance directive?: No - Patient declined    Additional Information 1:1 In Past 12 Months?: No CIRT Risk: No Elopement Risk: No Does patient have medical clearance?: Yes  Child/Adolescent Assessment Running Away Risk: Denies(Patient is an  adult)  Disposition:  Disposition Initial Assessment Completed for this Encounter: Yes  On Site Evaluation by:   Reviewed with Physician:    Lilyan Gilfordalvin J. Oziel Beitler MS, LCAS, LPC, NCC, CCSI Therapeutic Triage Specialist 04/03/2017 5:48 PM

## 2017-04-03 NOTE — ED Notes (Addendum)
Back in room to speak with patient further, reports that she does want to kill herself and reports that if she were to get out of the hospital she would try to kill herself. States that if she had access to anything that would hurt her that she would kill herself now. Makes safety contract with this RN. Suicide sitter ordered as she now voices SI. Will notify charge RN.

## 2017-04-03 NOTE — Consult Note (Signed)
[facility] Consult   Reason for Consult: Consult for 42 year old woman who came to the emergency room at the urging of her outpatient mental health provider Referring Physician: McShane Patient Identification: Brooke Hogan MRN:  [id number] Principal Diagnosis: Psychosis [facility]) Diagnosis:   Patient Active Problem List   Diagnosis Date Noted  . Psychosis ([place]) [F29] 04/03/2017  . Anxiety disorder [F41.9] 04/03/2017  . Suicidal ideation [R45.851] 04/03/2017  . Hypertension [I10] 04/03/2017    Total Time spent with patient: 1 hour  Subjective:   Brooke Hogan is a 42 y.o. female patient admitted with "I feel like I am trapped inside".  HPI: Patient interviewed chart reviewed.  42 year old woman who had come to the emergency room last night with a complaint of a panic attack and had been discharged came back to the hospital today after seeing a counselor at [facility].  Patient evidently saw the counselor but then also went home and started writing out a suicide note to her son.  She was cooperative however with coming into the emergency room.  Patient is confused and not very clear in her history although she is making an effort to be cooperative.  She describes symptoms such as "I feel like I am to people" and "I feel like I have to choose between 2 people".  When I try to get any more specific about this she actually gets more disorganized.  As she gets more disorganized she starts to talk about strange things like believing that people represent different colors and that her actions are going to cause people to change allegiances between different cities.  She realizes this does not make any sense and she is having a hard time describing it.  It sounds like her thinking stays confused most of the time.  She is sleeping poorly and eating poorly.  She has not been able to go back to work since losing her last job several months ago because of the confusion.  Today she  said she finally just felt like she was tired of all of what she was going through so she decided to write a suicide note.  She had not been receiving any mental health treatment although about a month ago her doctor had prescribed Prozac for her.  She did not comply with that however so I do not think we can think that the medicine is causing any of her problems.  Patient denies that she has been drinking or abusing any drugs recently.  Denies homicidal thoughts.  Social history: Lives with her grandmother and HER-2 sons ages 29 and 73.  Last was working about 10 or 11 months ago and lost her job.  Since then has just been staying inside feeling confused.  The when she tries to describe her relationship with her extended family things get disorganized and strange.  Medical history: History of high blood pressure supposed to be on lisinopril and atenolol.  No other known medical problems.  Substance abuse history: Denies that she has been drinking any time recently.  Says that she used to smoke weed but has not had any drugs in months may be more than that.  Past Psychiatric History: No previous psychiatric hospitalization.  She has seen outpatient providers but has not been consistently compliant.  She had one suicide attempt in her early 7s.  Only medicine she can remember being prescribed is Prozac.  Risk to Self: Suicidal Ideation: Yes-Currently Present Suicidal Intent: No Is patient at risk for  suicide?: Yes Suicidal Plan?: Yes-Currently Present Specify Current Suicidal Plan: Cut her wrist Access to Means: Yes Specify Access to Suicidal Means: Sharp objectives What has been your use of drugs/alcohol within the last 12 months?: Reports of no current use How many times?: 2 Other Self Harm Risks: Reports of none Triggers for Past Attempts: Spouse contact Intentional Self Injurious Behavior: None Risk to Others: Homicidal Ideation: No Thoughts of Harm to Others: No Current Homicidal Intent:  No Current Homicidal Plan: No Access to Homicidal Means: No Identified Victim: Reports of none History of harm to others?: No Assessment of Violence: None Noted Violent Behavior Description: Reports of none Does patient have access to weapons?: No Criminal Charges Pending?: No Does patient have a court date: No Prior Inpatient Therapy: Prior Inpatient Therapy: No Prior Outpatient Therapy: Prior Outpatient Therapy: Yes Prior Therapy Dates: Current Prior Therapy Facilty/Provider(s): [facility] Reason for Treatment: Depression Does patient have an ACCT team?: No Does patient have [facility] services? : No Does patient have [facility] services?: No  Past Medical History:  Past Medical History:  Diagnosis Date  . Anxiety   . Depression   . Hypertension    History reviewed. No pertinent surgical history. Family History: No family history on file. Family Psychiatric  History: She says her mother also had mental health problems.  She says that they were the same as her own but has difficulty describing it any more than that. Social History:  Social History   Substance and Sexual Activity  Alcohol Use No     Social History   Substance and Sexual Activity  Drug Use No    Social History   Socioeconomic History  . Marital status: Divorced    Spouse name: None  . Number of children: None  . Years of education: None  . Highest education level: None  Social Needs  . Financial resource strain: None  . Food insecurity - worry: None  . Food insecurity - inability: None  . Transportation needs - medical: None  . Transportation needs - non-medical: None  Occupational History  . None  Tobacco Use  . Smoking status: Former Smoker    Packs/day: 0.50    Types: Cigarettes  . Smokeless tobacco: Never Used  Substance and Sexual Activity  . Alcohol use: No  . Drug use: No  . Sexual activity: None  Other Topics Concern  . None  Social History Narrative  . None   Additional Social  History:    Allergies:  No Known Allergies  Labs:  Results for orders placed or performed during the hospital encounter of 04/03/17 (from the past 48 hour(s))  Comprehensive metabolic panel     Status: Abnormal   Collection Time: 04/03/17  1:25 PM  Result Value Ref Range   Sodium 141 135 - 145 mmol/L   Potassium 3.6 3.5 - 5.1 mmol/L   Chloride 106 101 - 111 mmol/L   CO2 24 22 - 32 mmol/L   Glucose, Bld 130 (H) 65 - 99 mg/dL   BUN 18 6 - 20 mg/dL   Creatinine, Ser 0.72 0.44 - 1.00 mg/dL   Calcium 9.3 8.9 - 10.3 mg/dL   Total Protein 8.2 (H) 6.5 - 8.1 g/dL   Albumin 4.5 3.5 - 5.0 g/dL   AST 23 15 - 41 U/L   ALT 27 14 - 54 U/L   Alkaline Phosphatase 57 38 - 126 U/L   Total Bilirubin 0.9 0.3 - 1.2 mg/dL   GFR calc non Af  Amer >60 >60 mL/min   GFR calc Af Amer >60 >60 mL/min    Comment: (NOTE) The eGFR has been calculated using the CKD EPI equation. This calculation has not been validated in all clinical situations. eGFR's persistently <60 mL/min signify possible Chronic Kidney Disease.    Anion gap 11 5 - 15    Comment: Performed at [facility], [address] [place], [place] [address]  Ethanol     Status: None   Collection Time: 04/03/17  1:25 PM  Result Value Ref Range   Alcohol, Ethyl (B) <10 <10 mg/dL    Comment:        LOWEST DETECTABLE LIMIT FOR SERUM ALCOHOL IS 10 mg/dL FOR MEDICAL PURPOSES ONLY Performed at [facility], [address] [place], [place] [address]   Salicylate level     Status: None   Collection Time: 04/03/17  1:25 PM  Result Value Ref Range   Salicylate Lvl <4.4 2.8 - 30.0 mg/dL    Comment: Performed at [facility], [address] [place], [place] [address]  Acetaminophen level     Status: Abnormal   Collection Time: 04/03/17  1:25 PM  Result Value Ref Range   Acetaminophen (Tylenol), Serum <10 (L) 10 - 30 ug/mL    Comment:        THERAPEUTIC CONCENTRATIONS VARY SIGNIFICANTLY. A RANGE OF 10-30 ug/mL MAY  BE AN EFFECTIVE CONCENTRATION FOR MANY PATIENTS. HOWEVER, SOME ARE BEST TREATED AT CONCENTRATIONS OUTSIDE THIS RANGE. ACETAMINOPHEN CONCENTRATIONS >150 ug/mL AT 4 HOURS AFTER INGESTION AND >50 ug/mL AT 12 HOURS AFTER INGESTION ARE OFTEN ASSOCIATED WITH TOXIC REACTIONS. Performed at [facility], [address] [place], [place] [address]   cbc     Status: None   Collection Time: 04/03/17  1:25 PM  Result Value Ref Range   WBC 4.9 3.6 - 11.0 K/uL   RBC 4.44 3.80 - 5.20 MIL/uL   Hemoglobin 13.3 12.0 - 16.0 g/dL   HCT 40.0 35.0 - 47.0 %   MCV 90.0 80.0 - 100.0 fL   MCH 29.9 26.0 - 34.0 pg   MCHC 33.3 32.0 - 36.0 g/dL   RDW 13.0 11.5 - 14.5 %   Platelets 365 150 - 440 K/uL    Comment: Performed at [facility], [address] [place], [place] [address]  Urine Drug Screen, Qualitative     Status: Abnormal   Collection Time: 04/03/17  1:25 PM  Result Value Ref Range   Tricyclic, Ur Screen NONE DETECTED NONE DETECTED   Amphetamines, Ur Screen NONE DETECTED NONE DETECTED   MDMA (Ecstasy)Ur Screen NONE DETECTED NONE DETECTED   Cocaine Metabolite,Ur [place] NONE DETECTED NONE DETECTED   Opiate, Ur Screen NONE DETECTED NONE DETECTED   Phencyclidine (PCP) Ur S NONE DETECTED NONE DETECTED   Cannabinoid 50 Ng, Ur [place] NONE DETECTED NONE DETECTED   Barbiturates, Ur Screen POSITIVE (A) NONE DETECTED   Benzodiazepine, Ur Scrn POSITIVE (A) NONE DETECTED   Methadone Scn, Ur NONE DETECTED NONE DETECTED    Comment: (NOTE) Tricyclics + metabolites, urine    Cutoff 1000 ng/mL Amphetamines + metabolites, urine  Cutoff 1000 ng/mL MDMA (Ecstasy), urine              Cutoff 500 ng/mL Cocaine Metabolite, urine          Cutoff 300 ng/mL Opiate + metabolites, urine        Cutoff 300 ng/mL Phencyclidine (PCP), urine  Cutoff 25 ng/mL Cannabinoid, urine                 Cutoff 50 ng/mL Barbiturates + metabolites, urine  Cutoff 200 ng/mL Benzodiazepine, urine              Cutoff 200  ng/mL Methadone, urine                   Cutoff 300 ng/mL The urine drug screen provides only a preliminary, unconfirmed analytical test result and should not be used for non-medical purposes. Clinical consideration and professional judgment should be applied to any positive drug screen result due to possible interfering substances. A more specific alternate chemical method must be used in order to obtain a confirmed analytical result. Gas chromatography / mass spectrometry (GC/MS) is the preferred confirmat ory method. Performed at [facility], [address] [place], [place] [address]   Pregnancy, urine POC     Status: None   Collection Time: 04/03/17  1:47 PM  Result Value Ref Range   Preg Test, Ur NEGATIVE NEGATIVE    Comment:        THE SENSITIVITY OF THIS METHODOLOGY IS >24 mIU/mL     No current facility-administered medications for this encounter.    Current Outpatient Medications  Medication Sig Dispense Refill  . albuterol (PROVENTIL HFA;VENTOLIN HFA) 108 (90 Base) MCG/ACT inhaler Inhale 2 puffs into the lungs every 6 (six) hours as needed. 1 Inhaler 0  . cetirizine (ZYRTEC) 10 MG tablet Take 10 mg by mouth daily.    Marland Kitchen atenolol (TENORMIN) 25 MG tablet Take 50 mg by mouth daily.  5  . benzonatate (TESSALON PERLES) 100 MG capsule Take 1 capsule (100 mg total) by mouth every 6 (six) hours as needed for cough. (Patient not taking: Reported on 04/03/2017) 15 capsule 0  . cyclobenzaprine (FLEXERIL) 10 MG tablet Take 1 tablet (10 mg total) by mouth 3 (three) times daily as needed for muscle spasms. (Patient not taking: Reported on 04/03/2017) 30 tablet 0  . fexofenadine-pseudoephedrine (ALLEGRA-D) 60-120 MG 12 hr tablet Take 1 tablet by mouth 2 (two) times daily. (Patient not taking: Reported on 04/03/2017) 20 tablet 0  . FLUoxetine (PROZAC) 20 MG capsule Take 1 capsule (20 mg total) by mouth at bedtime. (Patient not taking: Reported on 04/03/2017) 30 capsule 0  . fluticasone  (FLONASE) 50 MCG/ACT nasal spray Place 1 spray into both nostrils daily.    . hydrOXYzine (ATARAX/VISTARIL) 25 MG tablet Take 25 mg by mouth 2 (two) times daily as needed.    Marland Kitchen ibuprofen (ADVIL,MOTRIN) 600 MG tablet Take 1 tablet (600 mg total) by mouth every 8 (eight) hours as needed. (Patient not taking: Reported on 04/03/2017) 30 tablet 0  . lisinopril-hydrochlorothiazide (PRINZIDE,ZESTORETIC) 10-12.5 MG tablet Take 1 tablet by mouth daily.  4  . OLANZapine (ZYPREXA) 2.5 MG tablet Take 1 tablet (2.5 mg total) by mouth at bedtime. (Patient not taking: Reported on 04/03/2017) 30 tablet 0  . predniSONE (DELTASONE) 20 MG tablet Take 3 tablets (60 mg total) by mouth daily. (Patient not taking: Reported on 04/03/2017) 12 tablet 0    Musculoskeletal: Strength & Muscle Tone: within normal limits Gait & Station: normal Patient leans: N/A  Psychiatric Specialty Exam: Physical Exam  Nursing note and vitals reviewed. Constitutional: She appears well-developed and well-nourished.  HENT:  Head: Normocephalic and atraumatic.  Eyes: Conjunctivae are normal. Pupils are equal, round, and reactive to light.  Neck: Normal range of motion.  Cardiovascular: Regular  rhythm and normal heart sounds.  Respiratory: Effort normal. No respiratory distress.  GI: Soft. She exhibits no distension.  Musculoskeletal: Normal range of motion.  Neurological: She is alert.  Skin: Skin is warm and dry.  Psychiatric: Her mood appears anxious. Her affect is blunt. Her speech is delayed. She is slowed. Thought content is paranoid. Cognition and memory are impaired. She expresses inappropriate judgment. She expresses suicidal ideation. She expresses no homicidal ideation.    Review of Systems  Constitutional: Negative.   HENT: Negative.   Eyes: Negative.   Respiratory: Negative.   Cardiovascular: Negative.   Gastrointestinal: Negative.   Musculoskeletal: Negative.   Skin: Negative.   Neurological: Negative.    Psychiatric/Behavioral: Positive for depression, hallucinations, memory loss and suicidal ideas. Negative for substance abuse. The patient is nervous/anxious and has insomnia.     Blood pressure 121/87, pulse 75, temperature 98.4 F (36.9 C), temperature source Oral, resp. rate 17, height 5' (1.524 m), weight 72.6 kg (160 lb), last menstrual period 03/19/2017, SpO2 100 %.Body mass index is 31.25 kg/m.  General Appearance: Casual  Eye Contact:  Fair  Speech:  Slow  Volume:  Decreased  Mood:  Anxious and Dysphoric  Affect:  Constricted  Thought Process:  Disorganized  Orientation:  Full (Time, Place, and Person)  Thought Content:  Illogical, Hallucinations: Auditory, Ideas of Reference:   Paranoia Delusions and Paranoid Ideation  Suicidal Thoughts:  Yes.  with intent/plan  Homicidal Thoughts:  No  Memory:  Immediate;   Good Recent;   Fair Remote;   Fair  Judgement:  Impaired  Insight:  Shallow  Psychomotor Activity:  Decreased  Concentration:  Concentration: Fair  Recall:  Poor  Fund of Knowledge:  Fair  Language:  Fair  Akathisia:  No  Handed:  Right  AIMS (if indicated):     Assets:  Desire for Improvement Housing Physical Health Resilience  ADL's:  Intact  Cognition:  Impaired,  Mild  Sleep:        Treatment Plan Summary: Daily contact with patient to assess and evaluate symptoms and progress in treatment, Medication management and Plan 42 year old woman who is presenting with a constellation of difficult to describe symptoms but it sounds very much to me like she is having psychotic symptoms.  When I asked her about hallucinations she quickly said yes but then when she tried to describe them was at a loss to make sense of it.  It is possible that this could be severe OCD with just a very poor ability to describe it but it sounds pretty psychotic to me.  Patient definitely needs admission to the hospital.  She is agreeable to this.  Case reviewed with TTS and emergency  room physician.  Low dose of antipsychotic will be started tonight.  Hopefully we will have a bed available as soon as possible.  Disposition: Recommend psychiatric Inpatient admission when medically cleared. Supportive therapy provided about ongoing stressors.  Alethia Berthold, MD 04/03/2017 6:47 PM

## 2017-04-03 NOTE — ED Notes (Signed)
Psychiatrist gives verbal orders to discontinue suicide sitter. q 15 minute observation.

## 2017-04-03 NOTE — ED Provider Notes (Signed)
[facility] Emergency Department Provider Note   ____________________________________________   First MD Initiated Contact with Patient 04/03/17 0136     (approximate)  I have reviewed the triage vital signs and the nursing notes.   HISTORY  Chief Complaint Panic Attack    HPI Brooke Hogan is a 42 y.o. female who comes into the hospital today with an anxiety attack.  The patient states that she is been having some chest pressure as well as palpitations.  She states that her heart been beating faster and her left arm is been going numb.  The symptoms are typical of her anxiety attacks but they have been lasting longer and longer which makes the patient more anxious.  The patient states that she is supposed to take Prozac as she was seen here in January with anxiety but she reports that she has not been taking it.  She stayed on it for over a year and a half and then stated that she thinks her life was getting worse because she was taking the medicine.  The patient states that she has not gone to [facility] but goes to the [facility] where she has someone that she can speak to about her anxiety.  The patient reports though that she does not know when it could be a heart attack versus anxiety so she decided to come in and get checked out.  The patient rates her pain an 8 out of 10 in intensity.  She reports that her pain is typically at that level.  The symptoms started 2 days ago.  The patient has not had any shortness of breath or sweatiness.  She denies nausea or vomiting.  She is here today for evaluation of her chest pain and anxiety.  Past Medical History:  Diagnosis Date  . Anxiety   . Hypertension     There are no active problems to display for this patient.   History reviewed. No pertinent surgical history.  Prior to Admission medications   Medication Sig Start Date End Date Taking? Authorizing Provider  albuterol (PROVENTIL HFA;VENTOLIN HFA) 108  (90 Base) MCG/ACT inhaler Inhale 2 puffs into the lungs every 6 (six) hours as needed. 10/08/16   Rebecka Apley, MD  atenolol (TENORMIN) 25 MG tablet Take 50 mg by mouth daily. 04/02/16   [provider]  benzonatate (TESSALON PERLES) 100 MG capsule Take 1 capsule (100 mg total) by mouth every 6 (six) hours as needed for cough. 10/08/16   Rebecka Apley, MD  cetirizine (ZYRTEC) 10 MG tablet Take 10 mg by mouth daily.    [provider]  cyclobenzaprine (FLEXERIL) 10 MG tablet Take 1 tablet (10 mg total) by mouth 3 (three) times daily as needed for muscle spasms. 01/13/17   Merrily Brittle, MD  fexofenadine-pseudoephedrine (ALLEGRA-D) 60-120 MG 12 hr tablet Take 1 tablet by mouth 2 (two) times daily. 03/17/16   Joni Reining, PA-C  FLUoxetine (PROZAC) 20 MG capsule Take 1 capsule (20 mg total) by mouth at bedtime. 02/01/17 02/01/18  Willy Eddy, MD  fluticasone (FLONASE) 50 MCG/ACT nasal spray Place 1 spray into both nostrils daily.    [provider]  ibuprofen (ADVIL,MOTRIN) 600 MG tablet Take 1 tablet (600 mg total) by mouth every 8 (eight) hours as needed. 01/13/17   Merrily Brittle, MD  lisinopril-hydrochlorothiazide (PRINZIDE,ZESTORETIC) 10-12.5 MG tablet Take 1 tablet by mouth daily. 04/02/16   [provider]  OLANZapine (ZYPREXA) 2.5 MG tablet Take 1 tablet (  2.5 mg total) by mouth at bedtime. 02/01/17 02/01/18  Willy Eddy, MD  predniSONE (DELTASONE) 20 MG tablet Take 3 tablets (60 mg total) by mouth daily. 10/08/16   Rebecka Apley, MD    Allergies Patient has no known allergies.  No family history on file.  Social History Social History   Tobacco Use  . Smoking status: Former Smoker    Packs/day: 0.50    Types: Cigarettes  . Smokeless tobacco: Never Used  Substance Use Topics  . Alcohol use: No  . Drug use: No    Review of Systems  Constitutional: No fever/chills Eyes: No visual changes. ENT: No sore throat. Cardiovascular:   chest pain, rotations Respiratory: Denies shortness of breath. Gastrointestinal: No abdominal pain.  No nausea, no vomiting.  No diarrhea.  No constipation. Genitourinary: Negative for dysuria. Musculoskeletal: Negative for back pain. Skin: Negative for rash. Neurological: Negative for headaches, focal weakness or numbness. Psych: Anxiety  ____________________________________________   PHYSICAL EXAM:  VITAL SIGNS: ED Triage Vitals  Enc Vitals Group     BP 04/02/17 2324 128/87     Pulse Rate 04/02/17 2324 68     Resp 04/02/17 2324 16     Temp 04/02/17 2324 97.7 F (36.5 C)     Temp Source 04/02/17 2324 Oral     SpO2 04/02/17 2324 100 %     Weight 04/02/17 2325 160 lb (72.6 kg)     Height 04/02/17 2325 5' (1.524 m)     Head Circumference --      Peak Flow --      Pain Score --      Pain Loc --      Pain Edu? --      Excl. in GC? --     Constitutional: Alert and oriented. Well appearing and in mild distress. Eyes: Conjunctivae are normal. PERRL. EOMI. Head: Atraumatic. Nose: No congestion/rhinnorhea. Mouth/Throat: Mucous membranes are moist.  Oropharynx non-erythematous. Cardiovascular: Normal rate, regular rhythm. Grossly normal heart sounds.  Good peripheral circulation. Respiratory: Normal respiratory effort.  No retractions. Lungs CTAB. Gastrointestinal: Soft and nontender. No distention.  Positive bowel sounds Musculoskeletal: No lower extremity tenderness nor edema.   Neurologic:  Normal speech and language.  Skin:  Skin is warm, dry and intact.  Psychiatric: Mood and affect are normal.   ____________________________________________   LABS (all labs ordered are listed, but only abnormal results are displayed)  Labs Reviewed  CBC  BASIC METABOLIC PANEL  TROPONIN I   ____________________________________________  EKG  ED ECG REPORT I, Rebecka Apley, the attending physician, personally viewed and interpreted this ECG.   Date: 04/03/2017  EKG  Time: 2321  Rate: 70  Rhythm: normal sinus rhythm  Axis: normal  Intervals:none  ST&T Change: none  ____________________________________________  RADIOLOGY  ED MD interpretation:  CXR: No acute disease  Official radiology report(s): Dg Chest 2 View  Result Date: 04/03/2017 CLINICAL DATA:  Panic attacks EXAM: CHEST - 2 VIEW COMPARISON:  01/13/2017 FINDINGS: The heart size and mediastinal contours are within normal limits. Both lungs are clear. The visualized skeletal structures are unremarkable. IMPRESSION: No active cardiopulmonary disease. Electronically Signed   By: Jasmine Pang M.D.   On: 04/03/2017 02:24    ____________________________________________   PROCEDURES  Procedure(s) performed: None  Procedures  Critical Care performed: No  ____________________________________________   INITIAL IMPRESSION / ASSESSMENT AND PLAN / ED COURSE  As part of my medical decision making, I reviewed the following data within the electronic  MEDICAL RECORD NUMBER Notes from prior ED visits and [place] Controlled Substance Database   This is a 42 year old female who comes into the hospital today with an anxiety attack with chest pain, palpitations and left arm numbness.  My differential diagnosis includes anxiety, acute coronary syndrome, pulmonary pathology.  The patient did receive an EKG which was unremarkable.  I will check some blood work and give the patient a dose of aspirin.  I will also send the patient for chest x-ray and she will be reassessed.  I will give the patient 1 mg of Ativan.     The patient's blood work returned and was unremarkable.  Patient will be discharged home to follow-up with her primary care physician as well as [facility] for further evaluation of her anxiety symptoms.  The patient does need to resume taking her anxiety medicine.  We did not perform a second troponin due to the patient's symptoms lasting for more than 24  hours. ____________________________________________   FINAL CLINICAL IMPRESSION(S) / ED DIAGNOSES  Final diagnoses:  Chest pain, unspecified type  Anxiety     ED Discharge Orders    None       Note:  This document was prepared using Dragon voice recognition software and may include unintentional dictation errors.    Rebecka ApleyWebster, Allison P, MD 04/03/17 [id number]

## 2017-04-03 NOTE — ED Notes (Signed)
Pt. To BHU from ED ambulatory without difficulty, to room  BHU2. [facility]. Pt. Is alert and oriented, warm and dry in no distress. Pt. Denies SI, HI, and AVH. Pt. Calm and cooperative but tearful. Pt. Made aware of security cameras and Q15 minute rounds. Pt had a hair piece in that was removed and placed in belongings bag in locked belongings room. Pt. Encouraged to let Nursing staff know of any concerns or needs.

## 2017-04-03 NOTE — ED Notes (Signed)
[facility] once EKG preformed.

## 2017-04-03 NOTE — ED Notes (Addendum)
Pt roomed to ED23 at this time.  Scott, ED tech to bedside to sit until further assessment by EDP.  Charge RN aware.

## 2017-04-03 NOTE — ED Provider Notes (Signed)
-----------------------------------------   8:04 PM on 04/03/2017 -----------------------------------------  Patient was on psychiatric hold here and still is.  Has been stable during my shift thus far.  Medically cleared prior to my arrival.  EKG was performed at the behest of perhaps admitting physicians?  In any event, EKG is normal sinus rhythm at 66 bpm no acute ST elevation or depression, unremarkable EKG   Jeanmarie PlantMcShane, Cobe Viney A, MD 04/03/17 2004

## 2017-04-03 NOTE — ED Notes (Addendum)
Pt dressed out into appropriate behavioral health clothing. Pt belongings consist a gray jacket, a debit card inside of some papers, a cell phone, a black shirt, blue pants, black shoes, black socks, purple panties and a black head wrap. Pt has one bag of belongings. Pt was ok for password to be given to Sherron MondayLois Fuller pt aunt.

## 2017-04-04 MED ORDER — LORAZEPAM 1 MG PO TABS
1.0000 mg | ORAL_TABLET | Freq: Once | ORAL | Status: AC
  Administered 2017-04-04: 1 mg via ORAL
  Filled 2017-04-04: qty 1

## 2017-04-04 NOTE — Consult Note (Signed)
[place] Psychiatry Consult   Reason for Consult: Follow-up consult 42 year old woman who presented to the emergency room with what sounds like emerging psychotic symptoms.  Has been awaiting inpatient admission Referring Physician: Jimmye Norman Patient Identification: Brooke Hogan MRN:  [id number] Principal Diagnosis: Psychosis [facility]) Diagnosis:   Patient Active Problem List   Diagnosis Date Noted  . Psychosis ([place]) [F29] 04/03/2017  . Anxiety disorder [F41.9] 04/03/2017  . Suicidal ideation [R45.851] 04/03/2017  . Hypertension [I10] 04/03/2017    Total Time spent with patient: 15 minutes  Subjective:   Brooke Hogan is a 42 y.o. female patient admitted with "I am feeling okay".  HPI: Patient interviewed.  Chart reviewed.  Blood pressure has stabilized and is normal.  EKG observed and shows no abnormalities and a normal QT interval.  Patient has not had any behavior problems.  Slept adequately.  Tolerated medicine.  Continues to be confused and anxious disorganized.  Patient has expressed a preference not to be transferred to another hospital but understands the importance of getting her to an inpatient bed as soon as possible.  Patient has a past history of  Past Psychiatric History: Anxiety disorder but it seems to be emerging as having more psychotic and disorganized features  Risk to Self: Suicidal Ideation: Yes-Currently Present Suicidal Intent: No Is patient at risk for suicide?: Yes Suicidal Plan?: Yes-Currently Present Specify Current Suicidal Plan: Cut her wrist Access to Means: Yes Specify Access to Suicidal Means: Sharp objectives What has been your use of drugs/alcohol within the last 12 months?: Reports of no current use How many times?: 2 Other Self Harm Risks: Reports of none Triggers for Past Attempts: Spouse contact Intentional Self Injurious Behavior: None Risk to Others: Homicidal Ideation: No Thoughts of Harm to Others: No Current Homicidal  Intent: No Current Homicidal Plan: No Access to Homicidal Means: No Identified Victim: Reports of none History of harm to others?: No Assessment of Violence: None Noted Violent Behavior Description: Reports of none Does patient have access to weapons?: No Criminal Charges Pending?: No Does patient have a court date: No Prior Inpatient Therapy: Prior Inpatient Therapy: No Prior Outpatient Therapy: Prior Outpatient Therapy: Yes Prior Therapy Dates: Current Prior Therapy Facilty/Provider(s): [facility] Reason for Treatment: Depression Does patient have an ACCT team?: No Does patient have [facility] services? : No Does patient have P4CC services?: No  Past Medical History:  Past Medical History:  Diagnosis Date  . Anxiety   . Depression   . Hypertension    History reviewed. No pertinent surgical history. Family History: No family history on file. Family Psychiatric  History: Unclear.  Possibly a relative with a psychotic disorder. Social History:  Social History   Substance and Sexual Activity  Alcohol Use No     Social History   Substance and Sexual Activity  Drug Use No    Social History   Socioeconomic History  . Marital status: Divorced    Spouse name: None  . Number of children: None  . Years of education: None  . Highest education level: None  Social Needs  . Financial resource strain: None  . Food insecurity - worry: None  . Food insecurity - inability: None  . Transportation needs - medical: None  . Transportation needs - non-medical: None  Occupational History  . None  Tobacco Use  . Smoking status: Former Smoker    Packs/day: 0.50    Types: Cigarettes  . Smokeless tobacco: Never Used  Substance and Sexual Activity  .  Alcohol use: No  . Drug use: No  . Sexual activity: None  Other Topics Concern  . None  Social History Narrative  . None   Additional Social History:    Allergies:  No Known Allergies  Labs:  Results for orders placed or  performed during the hospital encounter of 04/03/17 (from the past 48 hour(s))  Comprehensive metabolic panel     Status: Abnormal   Collection Time: 04/03/17  1:25 PM  Result Value Ref Range   Sodium 141 135 - 145 mmol/L   Potassium 3.6 3.5 - 5.1 mmol/L   Chloride 106 101 - 111 mmol/L   CO2 24 22 - 32 mmol/L   Glucose, Bld 130 (H) 65 - 99 mg/dL   BUN 18 6 - 20 mg/dL   Creatinine, Ser 0.72 0.44 - 1.00 mg/dL   Calcium 9.3 8.9 - 10.3 mg/dL   Total Protein 8.2 (H) 6.5 - 8.1 g/dL   Albumin 4.5 3.5 - 5.0 g/dL   AST 23 15 - 41 U/L   ALT 27 14 - 54 U/L   Alkaline Phosphatase 57 38 - 126 U/L   Total Bilirubin 0.9 0.3 - 1.2 mg/dL   GFR calc non Af Amer >60 >60 mL/min   GFR calc Af Amer >60 >60 mL/min    Comment: (NOTE) The eGFR has been calculated using the CKD EPI equation. This calculation has not been validated in all clinical situations. eGFR's persistently <60 mL/min signify possible Chronic Kidney Disease.    Anion gap 11 5 - 15    Comment: Performed at [facility], [address] [place], [place] [address]  Ethanol     Status: None   Collection Time: 04/03/17  1:25 PM  Result Value Ref Range   Alcohol, Ethyl (B) <10 <10 mg/dL    Comment:        LOWEST DETECTABLE LIMIT FOR SERUM ALCOHOL IS 10 mg/dL FOR MEDICAL PURPOSES ONLY Performed at [facility], [address] [place], [place] [address]   Salicylate level     Status: None   Collection Time: 04/03/17  1:25 PM  Result Value Ref Range   Salicylate Lvl <3.9 2.8 - 30.0 mg/dL    Comment: Performed at [facility], [address] [place], [place] [address]  Acetaminophen level     Status: Abnormal   Collection Time: 04/03/17  1:25 PM  Result Value Ref Range   Acetaminophen (Tylenol), Serum <10 (L) 10 - 30 ug/mL    Comment:        THERAPEUTIC CONCENTRATIONS VARY SIGNIFICANTLY. A RANGE OF 10-30 ug/mL MAY BE AN EFFECTIVE CONCENTRATION FOR MANY PATIENTS. HOWEVER, SOME ARE BEST TREATED AT  CONCENTRATIONS OUTSIDE THIS RANGE. ACETAMINOPHEN CONCENTRATIONS >150 ug/mL AT 4 HOURS AFTER INGESTION AND >50 ug/mL AT 12 HOURS AFTER INGESTION ARE OFTEN ASSOCIATED WITH TOXIC REACTIONS. Performed at [facility], [address] [place], [place] [address]   cbc     Status: None   Collection Time: 04/03/17  1:25 PM  Result Value Ref Range   WBC 4.9 3.6 - 11.0 K/uL   RBC 4.44 3.80 - 5.20 MIL/uL   Hemoglobin 13.3 12.0 - 16.0 g/dL   HCT 40.0 35.0 - 47.0 %   MCV 90.0 80.0 - 100.0 fL   MCH 29.9 26.0 - 34.0 pg   MCHC 33.3 32.0 - 36.0 g/dL   RDW 13.0 11.5 - 14.5 %   Platelets 365 150 - 440 K/uL    Comment: Performed  at [facility], [address], [place], [place] [address]  Urine Drug Screen, Qualitative     Status: Abnormal   Collection Time: 04/03/17  1:25 PM  Result Value Ref Range   Tricyclic, Ur Screen NONE DETECTED NONE DETECTED   Amphetamines, Ur Screen NONE DETECTED NONE DETECTED   MDMA (Ecstasy)Ur Screen NONE DETECTED NONE DETECTED   Cocaine Metabolite,Ur [place] NONE DETECTED NONE DETECTED   Opiate, Ur Screen NONE DETECTED NONE DETECTED   Phencyclidine (PCP) Ur S NONE DETECTED NONE DETECTED   Cannabinoid 50 Ng, Ur [place] NONE DETECTED NONE DETECTED   Barbiturates, Ur Screen POSITIVE (A) NONE DETECTED   Benzodiazepine, Ur Scrn POSITIVE (A) NONE DETECTED   Methadone Scn, Ur NONE DETECTED NONE DETECTED    Comment: (NOTE) Tricyclics + metabolites, urine    Cutoff 1000 ng/mL Amphetamines + metabolites, urine  Cutoff 1000 ng/mL MDMA (Ecstasy), urine              Cutoff 500 ng/mL Cocaine Metabolite, urine          Cutoff 300 ng/mL Opiate + metabolites, urine        Cutoff 300 ng/mL Phencyclidine (PCP), urine         Cutoff 25 ng/mL Cannabinoid, urine                 Cutoff 50 ng/mL Barbiturates + metabolites, urine  Cutoff 200 ng/mL Benzodiazepine, urine              Cutoff 200 ng/mL Methadone, urine                   Cutoff 300 ng/mL The urine drug screen  provides only a preliminary, unconfirmed analytical test result and should not be used for non-medical purposes. Clinical consideration and professional judgment should be applied to any positive drug screen result due to possible interfering substances. A more specific alternate chemical method must be used in order to obtain a confirmed analytical result. Gas chromatography / mass spectrometry (GC/MS) is the preferred confirmat ory method. Performed at [facility], [address] [place], [place] [address]   Pregnancy, urine POC     Status: None   Collection Time: 04/03/17  1:47 PM  Result Value Ref Range   Preg Test, Ur NEGATIVE NEGATIVE    Comment:        THE SENSITIVITY OF THIS METHODOLOGY IS >24 mIU/mL     Current Facility-Administered Medications  Medication Dose Route Frequency Provider Last Rate Last Dose  . lisinopril (PRINIVIL,ZESTRIL) tablet 5 mg  5 mg Oral Daily Clapacs, Madie Reno, MD   5 mg at 04/04/17 1130  . OLANZapine (ZYPREXA) tablet 5 mg  5 mg Oral QHS Clapacs, Madie Reno, MD   5 mg at 04/03/17 2116   Current Outpatient Medications  Medication Sig Dispense Refill  . albuterol (PROVENTIL HFA;VENTOLIN HFA) 108 (90 Base) MCG/ACT inhaler Inhale 2 puffs into the lungs every 6 (six) hours as needed. 1 Inhaler 0  . cetirizine (ZYRTEC) 10 MG tablet Take 10 mg by mouth daily.    Marland Kitchen atenolol (TENORMIN) 25 MG tablet Take 50 mg by mouth daily.  5  . benzonatate (TESSALON PERLES) 100 MG capsule Take 1 capsule (100 mg total) by mouth every 6 (six) hours as needed for cough. (Patient not taking: Reported on 04/03/2017) 15 capsule 0  . cyclobenzaprine (FLEXERIL) 10 MG tablet Take 1 tablet (10 mg total) by mouth 3 (three) times daily as needed for muscle spasms. (  Patient not taking: Reported on 04/03/2017) 30 tablet 0  . fexofenadine-pseudoephedrine (ALLEGRA-D) 60-120 MG 12 hr tablet Take 1 tablet by mouth 2 (two) times daily. (Patient not taking: Reported on 04/03/2017) 20 tablet 0   . FLUoxetine (PROZAC) 20 MG capsule Take 1 capsule (20 mg total) by mouth at bedtime. (Patient not taking: Reported on 04/03/2017) 30 capsule 0  . fluticasone (FLONASE) 50 MCG/ACT nasal spray Place 1 spray into both nostrils daily.    . hydrOXYzine (ATARAX/VISTARIL) 25 MG tablet Take 25 mg by mouth 2 (two) times daily as needed.    Marland Kitchen ibuprofen (ADVIL,MOTRIN) 600 MG tablet Take 1 tablet (600 mg total) by mouth every 8 (eight) hours as needed. (Patient not taking: Reported on 04/03/2017) 30 tablet 0  . lisinopril-hydrochlorothiazide (PRINZIDE,ZESTORETIC) 10-12.5 MG tablet Take 1 tablet by mouth daily.  4  . OLANZapine (ZYPREXA) 2.5 MG tablet Take 1 tablet (2.5 mg total) by mouth at bedtime. (Patient not taking: Reported on 04/03/2017) 30 tablet 0  . predniSONE (DELTASONE) 20 MG tablet Take 3 tablets (60 mg total) by mouth daily. (Patient not taking: Reported on 04/03/2017) 12 tablet 0    Musculoskeletal: Strength & Muscle Tone: within normal limits Gait & Station: normal Patient leans: N/A  Psychiatric Specialty Exam: Physical Exam  Nursing note and vitals reviewed. Constitutional: She appears well-developed and well-nourished.  HENT:  Head: Normocephalic and atraumatic.  Eyes: Conjunctivae are normal. Pupils are equal, round, and reactive to light.  Neck: Normal range of motion.  Cardiovascular: Regular rhythm and normal heart sounds.  Respiratory: Effort normal. No respiratory distress.  GI: Soft.  Musculoskeletal: Normal range of motion.  Neurological: She is alert.  Skin: Skin is warm and dry.  Psychiatric: She has a normal mood and affect. Her speech is normal. Judgment normal. She is withdrawn. Thought content is delusional. Cognition and memory are normal. She expresses no suicidal ideation.    Review of Systems  Constitutional: Negative.   HENT: Negative.   Eyes: Negative.   Respiratory: Negative.   Cardiovascular: Negative.   Gastrointestinal: Negative.   Musculoskeletal:  Negative.   Skin: Negative.   Neurological: Negative.   Psychiatric/Behavioral: Positive for depression, hallucinations and suicidal ideas. Negative for substance abuse. The patient is nervous/anxious.     Blood pressure 132/86, pulse 77, temperature 98.1 F (36.7 C), temperature source Oral, resp. rate 18, height 5' (1.524 m), weight 72.6 kg (160 lb), last menstrual period 03/19/2017, SpO2 100 %.Body mass index is 31.25 kg/m.  General Appearance: Casual  Eye Contact:  Good  Speech:  Clear and Coherent  Volume:  Normal  Mood:  Anxious  Affect:  Blunt  Thought Process:  Disorganized  Orientation:  Full (Time, Place, and Person)  Thought Content:  Illogical  Suicidal Thoughts:  No  Homicidal Thoughts:  No  Memory:  Immediate;   Fair Recent;   Fair Remote;   Fair  Judgement:  Impaired  Insight:  Shallow  Psychomotor Activity:  Decreased  Concentration:  Concentration: Fair  Recall:  [employer] of Knowledge:  Fair  Language:  Fair  Akathisia:  No  Handed:  Right  AIMS (if indicated):     Assets:  Desire for Improvement Housing Physical Health Resilience Social Support  ADL's:  Intact  Cognition:  Impaired,  Mild  Sleep:        Treatment Plan Summary: Daily contact with patient to assess and evaluate symptoms and progress in treatment, Medication management and Plan 42 year old woman who is describing  what sounds like worsening psychotic symptoms with anxiety.  Poor functioning outside the hospital.  Having some suicidal thoughts.  She has been cooperative with treatment in the emergency room.  Started low dose Zyprexa last night.  Still confused and disorganized.  Patient has been accepted for transfer at [facility].  Plan explained to patient who is cooperative.  No change to current treatment plan.  Patient will be transferred as soon as the practicalities are worked out.  Disposition: Recommend psychiatric Inpatient admission when medically cleared. Supportive  therapy provided about ongoing stressors.  Alethia Berthold, MD 04/04/2017 12:42 PM

## 2017-04-04 NOTE — BH Assessment (Signed)
Patient's referral information faxed to:   [facility]    [address] [place] [address] Phone: [phone] Fax: [phone] [facility]    [address], [place] [address] Phone: [phone] Fax: [phone] [facility]    [address] [place] [address] Phone: [phone] Fax: [phone] [facility]   [address] [place] [address] Phone: [phone] Fax: [phone] [facility]    [address] [place] [address] Phone: [phone] Fax: [phone] [facility]   [address] [place] [address] Phone: [phone] Fax: [phone] [facility]   [address] [place] [address] Phone: [phone] Fax: [phone] [facility]   [address] [place] [address] Phone: [phone] Fax: [phone] [facility]    [address], [place] [address] Phone: [phone] Fax: [phone] [facility]    [address] [place] [address] Phone: [phone] Fax: [phone] [facility]    [address] [place] [address] Phone: [phone] Fax: [phone] [facility]   [address] [place] [address] Phone: [phone] Fax: [phone] [facility]    [address] [place]  [address] Phone: [phone] Fax: [phone] [facility]   [address] [place]  [address] Phone: [phone] Fax: [phone] 

## 2017-04-04 NOTE — BH Assessment (Signed)
[facility]  [facility] physician is Dr. Betti Cruzeddy.  Call report to [phone].  Representative was [place].   ER Staff is aware of it:  [facility] Sect.;  Selena BattenKim Patient's Nurse     Patient's Family/Support System (Adella [place] ColonyFitzgerald, RangerGrandmother) have been updated as well.

## 2017-04-04 NOTE — ED Notes (Signed)
EMTALA reviewed. 

## 2017-04-04 NOTE — ED Provider Notes (Signed)
[facility]   Brooke Hogan, Brooke Hogan E, MD 04/04/17 [phone]

## 2017-04-04 NOTE — ED Notes (Signed)
Patient is alert and oriented to person, place and time.  Presents with anxious affect and mood.  Medications given as prescribed.  Denies suicidal thoughts, auditory and visual hallucinations.  Routine safety checks maintained every 15 minutes.  Verbalizes understanding of discharge instructions.  Personal belonging returned to patient.

## 2017-04-04 NOTE — ED Provider Notes (Signed)
-----------------------------------------   6:14 AM on 04/04/2017 -----------------------------------------   Blood pressure 121/87, pulse 75, temperature 98.4 F (36.9 C), temperature source Oral, resp. rate 17, height 5' (1.524 m), weight 72.6 kg (160 lb), last menstrual period 03/19/2017, SpO2 100 %.  The patient had no acute events since last update.  Calm and cooperative at this time.  Disposition is pending Psychiatry/Behavioral Medicine team recommendations.     Irean HongSung, Jade J, MD 04/04/17 [phone]

## 2017-07-16 ENCOUNTER — Other Ambulatory Visit: Payer: Self-pay | Admitting: Family Medicine

## 2017-07-16 DIAGNOSIS — M5412 Radiculopathy, cervical region: Secondary | ICD-10-CM

## 2017-07-16 DIAGNOSIS — M5414 Radiculopathy, thoracic region: Secondary | ICD-10-CM

## 2017-07-30 ENCOUNTER — Other Ambulatory Visit: Payer: Self-pay | Admitting: Family Medicine

## 2017-07-30 DIAGNOSIS — Z1231 Encounter for screening mammogram for malignant neoplasm of breast: Secondary | ICD-10-CM

## 2017-08-14 ENCOUNTER — Ambulatory Visit
Admission: RE | Admit: 2017-08-14 | Discharge: 2017-08-14 | Disposition: A | Discharge status: Home / Self Care | Payer: Medicaid Other | Source: Ambulatory Visit | Attending: Family Medicine | Admitting: Family Medicine

## 2017-08-14 DIAGNOSIS — M5031 Other cervical disc degeneration,  high cervical region: Secondary | ICD-10-CM | POA: Diagnosis not present

## 2017-08-14 DIAGNOSIS — M5416 Radiculopathy, lumbar region: Secondary | ICD-10-CM | POA: Insufficient documentation

## 2017-08-14 DIAGNOSIS — M5414 Radiculopathy, thoracic region: Secondary | ICD-10-CM

## 2017-08-14 DIAGNOSIS — M5412 Radiculopathy, cervical region: Secondary | ICD-10-CM

## 2018-09-15 ENCOUNTER — Other Ambulatory Visit: Payer: Self-pay

## 2018-09-15 DIAGNOSIS — Z20822 Contact with and (suspected) exposure to covid-19: Secondary | ICD-10-CM

## 2018-09-17 LAB — NOVEL CORONAVIRUS, NAA: SARS-CoV-2, NAA: NOT DETECTED

## 2018-12-30 ENCOUNTER — Other Ambulatory Visit: Payer: Self-pay | Admitting: Family Medicine

## 2018-12-30 DIAGNOSIS — Z1231 Encounter for screening mammogram for malignant neoplasm of breast: Secondary | ICD-10-CM

## 2019-04-27 ENCOUNTER — Inpatient Hospital Stay: Admission: RE | Admit: 2019-04-27 | Payer: Medicaid Other | Source: Ambulatory Visit

## 2019-05-13 ENCOUNTER — Other Ambulatory Visit: Payer: Self-pay | Admitting: Student

## 2019-05-13 DIAGNOSIS — M5412 Radiculopathy, cervical region: Secondary | ICD-10-CM

## 2019-12-02 ENCOUNTER — Other Ambulatory Visit: Payer: Self-pay | Admitting: Family Medicine

## 2019-12-02 DIAGNOSIS — Z1231 Encounter for screening mammogram for malignant neoplasm of breast: Secondary | ICD-10-CM

## 2019-12-31 ENCOUNTER — Ambulatory Visit: Payer: Medicaid Other

## 2019-12-31 ENCOUNTER — Other Ambulatory Visit: Payer: Self-pay

## 2019-12-31 ENCOUNTER — Ambulatory Visit
Admission: RE | Admit: 2019-12-31 | Discharge: 2019-12-31 | Disposition: A | Discharge status: Home / Self Care | Payer: Medicaid Other | Source: Ambulatory Visit | Attending: Family Medicine | Admitting: Family Medicine

## 2019-12-31 DIAGNOSIS — Z1231 Encounter for screening mammogram for malignant neoplasm of breast: Secondary | ICD-10-CM | POA: Diagnosis not present

## 2020-02-19 ENCOUNTER — Emergency Department: Payer: Medicaid Other

## 2020-02-19 ENCOUNTER — Other Ambulatory Visit: Payer: Self-pay

## 2020-02-19 ENCOUNTER — Encounter: Payer: Self-pay | Admitting: Emergency Medicine

## 2020-02-19 ENCOUNTER — Emergency Department
Admission: EM | Admit: 2020-02-19 | Discharge: 2020-02-19 | Disposition: A | Discharge status: Home / Self Care | Payer: Medicaid Other | Attending: Emergency Medicine | Admitting: Emergency Medicine

## 2020-02-19 DIAGNOSIS — Z20822 Contact with and (suspected) exposure to covid-19: Secondary | ICD-10-CM

## 2020-02-19 DIAGNOSIS — Z79899 Other long term (current) drug therapy: Secondary | ICD-10-CM | POA: Diagnosis not present

## 2020-02-19 DIAGNOSIS — F17210 Nicotine dependence, cigarettes, uncomplicated: Secondary | ICD-10-CM | POA: Diagnosis not present

## 2020-02-19 DIAGNOSIS — U071 COVID-19: Secondary | ICD-10-CM | POA: Insufficient documentation

## 2020-02-19 DIAGNOSIS — R059 Cough, unspecified: Secondary | ICD-10-CM | POA: Diagnosis present

## 2020-02-19 DIAGNOSIS — I10 Essential (primary) hypertension: Secondary | ICD-10-CM | POA: Diagnosis not present

## 2020-02-19 LAB — SARS CORONAVIRUS 2 (TAT 6-24 HRS): SARS Coronavirus 2: POSITIVE — AB

## 2020-02-19 MED ORDER — PREDNISONE 10 MG (21) PO TBPK: ORAL_TABLET | ORAL | 0 refills | Status: AC

## 2020-02-19 MED ORDER — BENZONATATE 100 MG PO CAPS
100.0000 mg | ORAL_CAPSULE | Freq: Three times a day (TID) | ORAL | 0 refills | Status: AC | PRN
Start: 2020-02-19 — End: 2021-02-18

## 2020-02-19 NOTE — Discharge Instructions (Addendum)

## 2020-02-19 NOTE — ED Provider Notes (Signed)
[facility] Emergency Department Provider Note  ____________________________________________   None    (approximate)  I have reviewed the triage vital signs and the nursing notes.   HISTORY  Chief Complaint URI    HPI Brooke Hogan is a 45 y.o. female presents to the emergency department with URI symptoms for 5 days.   Is complaining of cough, congestion, denies fever, chills, chest pain, shortness of breath close contact with Covid19+ patient, patient is not vaccinated.  Patient is complaining of rib pain from coughing   Past Medical History:  Diagnosis Date  . Anxiety   . Depression   . Hypertension     Patient Active Problem List   Diagnosis Date Noted  . Psychosis (HCC) 04/03/2017  . Anxiety disorder 04/03/2017  . Suicidal ideation 04/03/2017  . Hypertension 04/03/2017    History reviewed. No pertinent surgical history.  Prior to Admission medications   Medication Sig Start Date End Date Taking? Authorizing Provider  benzonatate (TESSALON PERLES) 100 MG capsule Take 1 capsule (100 mg total) by mouth 3 (three) times daily as needed for cough. 02/19/20 02/18/21 Yes Arvis Miguez, Roselyn Bering, PA-C  predniSONE (STERAPRED UNI-PAK 21 TAB) 10 MG (21) TBPK tablet Take 6 pills on day one then decrease by 1 pill each day 02/19/20  Yes Aayliah Rotenberry, Roselyn Bering, PA-C  albuterol (PROVENTIL HFA;VENTOLIN HFA) 108 (90 Base) MCG/ACT inhaler Inhale 2 puffs into the lungs every 6 (six) hours as needed. 10/08/16   Rebecka Apley, MD  atenolol (TENORMIN) 25 MG tablet Take 50 mg by mouth daily. 04/02/16   [provider]  cetirizine (ZYRTEC) 10 MG tablet Take 10 mg by mouth daily.    [provider]  fluticasone (FLONASE) 50 MCG/ACT nasal spray Place 1 spray into both nostrils daily.    [provider]  hydrOXYzine (ATARAX/VISTARIL) 25 MG tablet Take 25 mg by mouth 2 (two) times daily as needed.    [provider]   lisinopril-hydrochlorothiazide (PRINZIDE,ZESTORETIC) 10-12.5 MG tablet Take 1 tablet by mouth daily. 04/02/16   [provider]  OLANZapine (ZYPREXA) 2.5 MG tablet Take 1 tablet (2.5 mg total) by mouth at bedtime. Patient not taking: Reported on 04/03/2017 02/01/17 02/01/18  Willy Eddy, MD  FLUoxetine (PROZAC) 20 MG capsule Take 1 capsule (20 mg total) by mouth at bedtime. Patient not taking: Reported on 04/03/2017 02/01/17 02/19/20  Willy Eddy, MD    Allergies Patient has no known allergies.  Family History  Problem Relation Age of Onset  . Breast cancer Maternal Aunt        mat aunt    Social History Social History   Tobacco Use  . Smoking status: Current Every Day Smoker    Packs/day: 0.50    Types: Cigarettes  . Smokeless tobacco: Never Used  Vaping Use  . Vaping Use: Never used  Substance Use Topics  . Alcohol use: No  . Drug use: No    Review of Systems  Constitutional: Denies fever/chills Eyes: No visual changes. ENT: Denies sore throat. Respiratory: Positive cough Cardiovascular: Denies chest pain Gastrointestinal: Denies abdominal pain Genitourinary: Negative for dysuria. Musculoskeletal: Negative for back pain. Skin: Negative for rash. Neurological: Denies neurological changes    ____________________________________________   PHYSICAL EXAM:  VITAL SIGNS: ED Triage Vitals  Enc Vitals Group     BP 02/19/20 1031 (!) 150/97     Pulse Rate 02/19/20 1031 83     Resp 02/19/20 1031 18     Temp 02/19/20  1031 98.6 F (37 C)     Temp Source 02/19/20 1031 Oral     SpO2 02/19/20 1031 100 %     Weight 02/19/20 1032 180 lb (81.6 kg)     Height 02/19/20 1032 5\' 2"  (1.575 m)     Head Circumference --      Peak Flow --      Pain Score 02/19/20 1037 8     Pain Loc --      Pain Edu? --      Excl. in GC? --     Constitutional: Alert and oriented. Well appearing and in no acute distress. Eyes: Conjunctivae are normal.  Head: Atraumatic. Nose:  No congestion/rhinnorhea. Mouth/Throat: Mucous membranes are moist.  Neck:  supple no lymphadenopathy noted Cardiovascular: Normal rate, regular rhythm. Heart sounds are normal Respiratory: Normal respiratory effort.  No retractions, lungs CTA, ribs are tender on the left side only GU: deferred Musculoskeletal: FROM all extremities, warm and well perfused Neurologic:  Normal speech and language.  Skin:  Skin is warm, dry and intact. No rash noted. Psychiatric: Mood and affect are normal. Speech and behavior are normal.  ____________________________________________   LABS (all labs ordered are listed, but only abnormal results are displayed)  Labs Reviewed  SARS CORONAVIRUS 2 (TAT 6-24 HRS)   ____________________________________________   ____________________________________________  RADIOLOGY  Chest x-ray is normal  ____________________________________________   PROCEDURES  Procedure(s) performed: No  Procedures    ____________________________________________   INITIAL IMPRESSION / ASSESSMENT AND PLAN / ED COURSE  Pertinent labs & imaging results that were available during my care of the patient were reviewed by me and considered in my medical decision making (see chart for details).   Patient is a 44 year old female who is not vaccinated for COVID who complains of URI symptoms.  Exam is consistent with covid.    Pending test for covid Chest x-ray was reviewed by me and confirmed by radiology is negative   The patient was instructed to quarantine themselves at home.  Follow-up with your regular doctor if any concerns.  Return emergency department for worsening. OTC measures discussed     Brooke Hogan was evaluated in Emergency Department on 02/19/2020 for the symptoms described in the history of present illness. She was evaluated in the context of the global COVID-19 pandemic, which necessitated consideration that the patient might be at risk for infection  with the SARS-CoV-2 virus that causes COVID-19. Institutional protocols and algorithms that pertain to the evaluation of patients at risk for COVID-19 are in a state of rapid change based on information released by regulatory bodies including the CDC and federal and state organizations. These policies and algorithms were followed during the patient's care in the ED.   As part of my medical decision making, I reviewed the following data within the electronic MEDICAL RECORD NUMBER Nursing notes reviewed and incorporated, Old chart reviewed, Radiograph reviewed , Notes from prior ED visits and [place] Controlled Substance Database  ____________________________________________   FINAL CLINICAL IMPRESSION(S) / ED DIAGNOSES  Final diagnoses:  Suspected COVID-19 virus infection      NEW MEDICATIONS STARTED DURING THIS VISIT:  New Prescriptions   BENZONATATE (TESSALON PERLES) 100 MG CAPSULE    Take 1 capsule (100 mg total) by mouth 3 (three) times daily as needed for cough.   PREDNISONE (STERAPRED UNI-PAK 21 TAB) 10 MG (21) TBPK TABLET    Take 6 pills on day one then decrease by 1 pill each day  Note:  This document was prepared using Dragon voice recognition software and may include unintentional dictation errors.    Faythe Ghee, PA-C 02/19/20 1257    Merwyn Katos, MD 02/19/20 1311

## 2020-02-19 NOTE — ED Triage Notes (Signed)
Pt c/o cough with sinus and chest congestion since Monday, states since last night having some posterior rib pain with coughing. Pt is in NAD, taking mucinex for the sx.

## 2020-02-19 NOTE — ED Notes (Signed)
Pt is pregnant and due in March  No abd pain or bleeding

## 2020-02-22 ENCOUNTER — Telehealth (HOSPITAL_COMMUNITY): Payer: Self-pay | Admitting: Family

## 2020-02-22 NOTE — Telephone Encounter (Signed)
Called to discuss with Nydia Bouton about Covid symptoms and the use of casirivimab/imdevimab, a combination monoclonal antibody infusion for those with mild to moderate Covid symptoms and at a high risk of hospitalization.     Pt is qualified for this infusion at the [facility] infusion center due to co-morbid conditions and/or a member of an at-risk group, however declines infusion at this time as she states she is feeling better. Symptoms tier reviewed as well as criteria for ending isolation.  Symptoms reviewed that would warrant ED/Hospital evaluation. Preventative practices reviewed. Patient verbalized understanding.   Patient Active Problem List   Diagnosis Date Noted  . Psychosis (HCC) 04/03/2017  . Anxiety disorder 04/03/2017  . Suicidal ideation 04/03/2017  . Hypertension 04/03/2017    Kasch Borquez,NP

## 2021-02-06 ENCOUNTER — Other Ambulatory Visit: Payer: Self-pay | Admitting: Family Medicine

## 2021-02-06 DIAGNOSIS — Z1231 Encounter for screening mammogram for malignant neoplasm of breast: Secondary | ICD-10-CM

## 2021-04-13 ENCOUNTER — Other Ambulatory Visit: Payer: Self-pay

## 2021-04-13 ENCOUNTER — Ambulatory Visit
Admission: RE | Admit: 2021-04-13 | Discharge: 2021-04-13 | Disposition: A | Discharge status: Home / Self Care | Payer: Medicaid Other | Source: Ambulatory Visit | Attending: Family Medicine | Admitting: Family Medicine

## 2021-04-13 DIAGNOSIS — Z1231 Encounter for screening mammogram for malignant neoplasm of breast: Secondary | ICD-10-CM | POA: Diagnosis present

## 2022-10-09 ENCOUNTER — Ambulatory Visit (LOCAL_COMMUNITY_HEALTH_CENTER): Payer: Self-pay

## 2022-10-09 ENCOUNTER — Other Ambulatory Visit: Payer: Self-pay

## 2022-10-09 DIAGNOSIS — Z111 Encounter for screening for respiratory tuberculosis: Secondary | ICD-10-CM

## 2022-10-12 ENCOUNTER — Ambulatory Visit (LOCAL_COMMUNITY_HEALTH_CENTER): Payer: Self-pay

## 2022-10-12 DIAGNOSIS — Z111 Encounter for screening for respiratory tuberculosis: Secondary | ICD-10-CM

## 2022-10-12 LAB — TB SKIN TEST
Induration: 0 mm
TB Skin Test: NEGATIVE

## 2023-10-11 ENCOUNTER — Encounter: Admitting: Obstetrics

## 2023-10-11 DIAGNOSIS — Z1231 Encounter for screening mammogram for malignant neoplasm of breast: Secondary | ICD-10-CM

## 2023-10-11 DIAGNOSIS — Z7689 Persons encountering health services in other specified circumstances: Secondary | ICD-10-CM

## 2023-10-11 DIAGNOSIS — Z124 Encounter for screening for malignant neoplasm of cervix: Secondary | ICD-10-CM

## 2023-10-11 DIAGNOSIS — Z01419 Encounter for gynecological examination (general) (routine) without abnormal findings: Secondary | ICD-10-CM

## 2023-11-24 ENCOUNTER — Emergency Department

## 2023-11-24 ENCOUNTER — Emergency Department
Admission: EM | Admit: 2023-11-24 | Discharge: 2023-11-24 | Disposition: A | Discharge status: Home / Self Care | Attending: Emergency Medicine | Admitting: Emergency Medicine

## 2023-11-24 DIAGNOSIS — I10 Essential (primary) hypertension: Secondary | ICD-10-CM | POA: Insufficient documentation

## 2023-11-24 DIAGNOSIS — J209 Acute bronchitis, unspecified: Secondary | ICD-10-CM | POA: Insufficient documentation

## 2023-11-24 DIAGNOSIS — R0602 Shortness of breath: Secondary | ICD-10-CM | POA: Diagnosis present

## 2023-11-24 LAB — BASIC METABOLIC PANEL WITH GFR
Anion gap: 12 (ref 5–15)
BUN: 15 mg/dL (ref 6–20)
CO2: 25 mmol/L (ref 22–32)
Calcium: 8.9 mg/dL (ref 8.9–10.3)
Chloride: 104 mmol/L (ref 98–111)
Creatinine, Ser: 0.83 mg/dL (ref 0.44–1.00)
GFR, Estimated: >60 mL/min (ref >60)
Glucose, Bld: 99 mg/dL (ref 70–99)
Potassium: 3.9 mmol/L (ref 3.5–5.1)
Sodium: 141 mmol/L (ref 135–145)

## 2023-11-24 LAB — CBC
HCT: 37.4 % (ref 36.0–46.0)
Hemoglobin: 12.3 g/dL (ref 12.0–15.0)
MCH: 29.3 pg (ref 26.0–34.0)
MCHC: 32.9 g/dL (ref 30.0–36.0)
MCV: 89 fL (ref 80.0–100.0)
Platelets: 389 K/uL (ref 150–400)
RBC: 4.2 MIL/uL (ref 3.87–5.11)
RDW: 12.7 % (ref 11.5–15.5)
WBC: 5.4 K/uL (ref 4.0–10.5)
nRBC: 0 % (ref 0.0–0.2)

## 2023-11-24 LAB — TROPONIN I (HIGH SENSITIVITY): Troponin I (High Sensitivity): <2 ng/L (ref <18)

## 2023-11-24 MED ORDER — AZITHROMYCIN 250 MG PO TABS: ORAL_TABLET | ORAL | 0 refills | Status: AC

## 2023-11-24 NOTE — ED Provider Notes (Signed)
 [facility] Provider Note    Event Date/Time   First MD Initiated Contact with Patient 11/24/23 1354     (approximate)  History   Chief Complaint: Chest Pain and Shortness of Breath  HPI  Brooke Hogan is a 48 y.o. female with a past medical history anxiety, depression, hypertension who presents to the emergency department with shortness of breath and chest pain over the last 2 weeks.  According to the patient over the last 2 weeks she has had a dry nonproductive cough.  Patient states at times she has some pain in her chest when she is coughing.  States slight shortness of breath as well.  No pleuritic pain.  No leg pain or swelling.  No fever.  Physical Exam   Triage Vital Signs: ED Triage Vitals  Encounter Vitals Group     BP 11/24/23 1334 (!) 177/96     Girls Systolic BP Percentile --      Girls Diastolic BP Percentile --      Boys Systolic BP Percentile --      Boys Diastolic BP Percentile --      Pulse Rate 11/24/23 1334 68     Resp 11/24/23 1334 18     Temp 11/24/23 1334 97.7 F (36.5 C)     Temp Source 11/24/23 1334 Oral     SpO2 11/24/23 1334 100 %     Weight 11/24/23 1332 177 lb (80.3 kg)     Height 11/24/23 1332 4' 11 (1.499 m)     Head Circumference --      Peak Flow --      Pain Score 11/24/23 1332 7     Pain Loc --      Pain Education --      Exclude from Growth Chart --     Most recent vital signs: Vitals:   11/24/23 1334  BP: (!) 177/96  Pulse: 68  Resp: 18  Temp: 97.7 F (36.5 C)  SpO2: 100%    General: Awake, no distress.  CV:  Good peripheral perfusion.  Regular rate and rhythm  Resp:  Normal effort.  Equal breath sounds bilaterally.  Abd:  No distention.  Soft, nontender.  No rebound or guarding.  ED Results / Procedures / Treatments   EKG  EKG viewed and interpreted by myself shows a normal sinus rhythm at 67 bpm with a narrow QRS, normal axis, normal intervals, no concerning ST changes.  RADIOLOGY  I  have reviewed interpret the chest x-ray images.  No consolidation on my evaluation. Radiology has read the x-ray is negative.   MEDICATIONS ORDERED IN ED: Medications - No data to display   IMPRESSION / MDM / ASSESSMENT AND PLAN / ED COURSE  I reviewed the triage vital signs and the nursing notes.  Patient's presentation is most consistent with acute presentation with potential threat to life or bodily function.  Patient presents to the emergency department for 2 weeks of dry cough now with some chest discomfort especially when coughing.  Patient's lab work here is reassuring with a reassuring CBC and normal white blood cell count normal chemistry negative troponin.  Chest x-ray is clear and EKG reassuring.  As symptoms have been ongoing for 1 to 2 weeks given her negative workup this is very reassuring.  Given the dry cough for 2 weeks however likely indicative more of acute bronchitis will cover with antibiotics as a precaution have the patient follow-up with her doctor.  Patient is  agreeable to this plan of care.  FINAL CLINICAL IMPRESSION(S) / ED DIAGNOSES   Acute bronchitis   Note:  This document was prepared using Dragon voice recognition software and may include unintentional dictation errors.   Dorothyann Drivers, MD 11/24/23 1434

## 2023-11-24 NOTE — ED Triage Notes (Signed)
 Pt presents to the ED via POV from home with chest pain and SHOB x2 weeks. Pt reports CP to be a 7/10. Reports non-productive cough x 1 week.
